# Patient Record
Sex: Female | Born: 1960 | Race: White | Hispanic: No | Marital: Married | Smoking: Former smoker
Health system: Southern US, Community
[De-identification: ages and names within clinical notes are randomized; demographics above are authoritative.]

## PROBLEM LIST (undated history)

## (undated) DIAGNOSIS — F32A Depression, unspecified: Secondary | ICD-10-CM

## (undated) DIAGNOSIS — M1990 Unspecified osteoarthritis, unspecified site: Secondary | ICD-10-CM

## (undated) DIAGNOSIS — F329 Major depressive disorder, single episode, unspecified: Secondary | ICD-10-CM

## (undated) DIAGNOSIS — E039 Hypothyroidism, unspecified: Secondary | ICD-10-CM

## (undated) DIAGNOSIS — R0789 Other chest pain: Secondary | ICD-10-CM

## (undated) DIAGNOSIS — F419 Anxiety disorder, unspecified: Secondary | ICD-10-CM

## (undated) HISTORY — DX: Hypothyroidism, unspecified: E03.9

## (undated) HISTORY — PX: PARTIAL HYSTERECTOMY: SHX80

## (undated) HISTORY — PX: TUBAL LIGATION: SHX77

---
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
## 1997-08-06 ENCOUNTER — Ambulatory Visit (HOSPITAL_COMMUNITY): Admission: RE | Admit: 1997-08-06 | Discharge: 1997-08-06 | Payer: Self-pay | Admitting: Obstetrics & Gynecology

## 1997-08-08 ENCOUNTER — Inpatient Hospital Stay (HOSPITAL_COMMUNITY): Admission: AD | Admit: 1997-08-08 | Discharge: 1997-08-08 | Payer: Self-pay | Admitting: Obstetrics and Gynecology

## 1997-08-13 ENCOUNTER — Other Ambulatory Visit: Admission: RE | Admit: 1997-08-13 | Discharge: 1997-08-13 | Payer: Self-pay | Admitting: Obstetrics & Gynecology

## 1998-08-05 ENCOUNTER — Other Ambulatory Visit: Admission: RE | Admit: 1998-08-05 | Discharge: 1998-08-05 | Payer: Self-pay | Admitting: Obstetrics & Gynecology

## 2000-06-10 ENCOUNTER — Other Ambulatory Visit: Admission: RE | Admit: 2000-06-10 | Discharge: 2000-06-10 | Payer: Self-pay | Admitting: Obstetrics & Gynecology

## 2000-12-04 ENCOUNTER — Emergency Department (HOSPITAL_COMMUNITY): Admission: EM | Admit: 2000-12-04 | Discharge: 2000-12-05 | Payer: Self-pay | Admitting: Emergency Medicine

## 2000-12-04 ENCOUNTER — Encounter: Payer: Self-pay | Admitting: Emergency Medicine

## 2001-06-30 ENCOUNTER — Other Ambulatory Visit: Admission: RE | Admit: 2001-06-30 | Discharge: 2001-06-30 | Payer: Self-pay | Admitting: Obstetrics & Gynecology

## 2001-12-14 ENCOUNTER — Ambulatory Visit (HOSPITAL_COMMUNITY): Admission: RE | Admit: 2001-12-14 | Discharge: 2001-12-14 | Payer: Self-pay | Admitting: Obstetrics & Gynecology

## 2001-12-14 ENCOUNTER — Encounter: Payer: Self-pay | Admitting: Obstetrics & Gynecology

## 2002-08-11 ENCOUNTER — Other Ambulatory Visit: Admission: RE | Admit: 2002-08-11 | Discharge: 2002-08-11 | Payer: Self-pay | Admitting: Obstetrics & Gynecology

## 2003-09-07 ENCOUNTER — Other Ambulatory Visit: Admission: RE | Admit: 2003-09-07 | Discharge: 2003-09-24 | Payer: Self-pay | Admitting: *Deleted

## 2003-11-07 ENCOUNTER — Other Ambulatory Visit (HOSPITAL_COMMUNITY): Admission: RE | Admit: 2003-11-07 | Discharge: 2003-12-09 | Payer: Self-pay | Admitting: Psychiatry

## 2003-11-07 ENCOUNTER — Ambulatory Visit: Payer: Self-pay | Admitting: Psychiatry

## 2004-09-02 ENCOUNTER — Other Ambulatory Visit: Admission: RE | Admit: 2004-09-02 | Discharge: 2004-09-02 | Payer: Self-pay | Admitting: Obstetrics & Gynecology

## 2005-08-06 ENCOUNTER — Emergency Department (HOSPITAL_COMMUNITY): Admission: EM | Admit: 2005-08-06 | Discharge: 2005-08-07 | Payer: Self-pay | Admitting: Emergency Medicine

## 2005-08-07 ENCOUNTER — Inpatient Hospital Stay (HOSPITAL_COMMUNITY): Admission: EM | Admit: 2005-08-07 | Discharge: 2005-08-07 | Payer: Self-pay | Admitting: Psychiatry

## 2005-08-07 ENCOUNTER — Ambulatory Visit: Payer: Self-pay | Admitting: Psychiatry

## 2005-12-04 ENCOUNTER — Encounter: Admission: RE | Admit: 2005-12-04 | Discharge: 2005-12-04 | Payer: Self-pay | Admitting: Sports Medicine

## 2009-01-19 ENCOUNTER — Observation Stay (HOSPITAL_COMMUNITY): Admission: AD | Admit: 2009-01-19 | Discharge: 2009-01-20 | Payer: Self-pay | Admitting: Obstetrics and Gynecology

## 2010-01-16 ENCOUNTER — Encounter: Payer: Self-pay | Admitting: Endocrinology

## 2010-01-22 ENCOUNTER — Ambulatory Visit: Payer: Self-pay | Admitting: Endocrinology

## 2010-01-22 DIAGNOSIS — E039 Hypothyroidism, unspecified: Secondary | ICD-10-CM | POA: Insufficient documentation

## 2010-01-22 DIAGNOSIS — L659 Nonscarring hair loss, unspecified: Secondary | ICD-10-CM | POA: Insufficient documentation

## 2010-01-22 DIAGNOSIS — D649 Anemia, unspecified: Secondary | ICD-10-CM | POA: Insufficient documentation

## 2010-01-22 DIAGNOSIS — R252 Cramp and spasm: Secondary | ICD-10-CM | POA: Insufficient documentation

## 2010-01-22 LAB — CONVERTED CEMR LAB
BUN: 8 mg/dL (ref 6–23)
Basophils Absolute: 0 10*3/uL (ref 0.0–0.1)
Basophils Relative: 0.4 % (ref 0.0–3.0)
CO2: 28 meq/L (ref 19–32)
Calcium: 8.9 mg/dL (ref 8.4–10.5)
Chloride: 106 meq/L (ref 96–112)
Creatinine, Ser: 0.7 mg/dL (ref 0.4–1.2)
Eosinophils Absolute: 0.1 10*3/uL (ref 0.0–0.7)
Eosinophils Relative: 1.7 % (ref 0.0–5.0)
GFR calc non Af Amer: 92.04 mL/min (ref >60)
Glucose, Bld: 94 mg/dL (ref 70–99)
HCT: 39.5 % (ref 36.0–46.0)
Hemoglobin: 13.9 g/dL (ref 12.0–15.0)
Iron: 13 ug/dL — ABNORMAL LOW (ref 42–145)
Lymphocytes Relative: 27.6 % (ref 12.0–46.0)
Lymphs Abs: 1.6 10*3/uL (ref 0.7–4.0)
MCHC: 35.1 g/dL (ref 30.0–36.0)
MCV: 91.6 fL (ref 78.0–100.0)
Monocytes Absolute: 0.6 10*3/uL (ref 0.1–1.0)
Monocytes Relative: 10.4 % (ref 3.0–12.0)
Neutro Abs: 3.5 10*3/uL (ref 1.4–7.7)
Neutrophils Relative %: 59.9 % (ref 43.0–77.0)
Platelets: 215 10*3/uL (ref 150.0–400.0)
Potassium: 4.2 meq/L (ref 3.5–5.1)
RBC: 4.32 M/uL (ref 3.87–5.11)
RDW: 11.9 % (ref 11.5–14.6)
Saturation Ratios: 3.7 % — ABNORMAL LOW (ref 20.0–50.0)
Sodium: 140 meq/L (ref 135–145)
Transferrin: 250.4 mg/dL (ref 212.0–360.0)
Vitamin B-12: 323 pg/mL (ref 211–911)
WBC: 5.9 10*3/uL (ref 4.5–10.5)

## 2010-01-23 LAB — CONVERTED CEMR LAB
Calcium, Total (PTH): 9.2 mg/dL (ref 8.4–10.5)
PTH: 30.9 pg/mL (ref 14.0–72.0)

## 2010-04-05 ENCOUNTER — Encounter: Payer: Self-pay | Admitting: Sports Medicine

## 2010-04-15 NOTE — Assessment & Plan Note (Signed)
Summary: New Endo/2nd opinion/Hypothyroid/Bcbs/#/cd   Vital Signs:  Patient profile:   50 year old female Height:      65 inches (165.10 cm) Weight:      163.50 pounds (74.32 kg) BMI:     27.31 O2 Sat:      99 % on Room air Temp:     98.7 degrees F (37.06 degrees C) oral Pulse rate:   87 / minute BP sitting:   114 / 80  (left arm) Cuff size:   regular  Vitals Entered By: Brenton Grills CMA Duncan Dull) (January 22, 2010 10:52 AM)  O2 Flow:  Room air CC: New Endo/2nd opinion Hypothyroid/aj Is Patient Diabetic? No   Primary Provider:  Joette Catching MD  CC:  New Endo/2nd opinion Hypothyroid/aj.  History of Present Illness: pt has 5 years h/o hypothyroidism.  she was found to have tsh of 18, and was rx'ed with synthroid.  sxs initially improved, and tsh normalized.   she now c/o 1 month of slight rash on the hairline, and assoc fatigue.    Current Medications (verified): 1)  Levothyroxine Sodium 100 Mcg Tabs (Levothyroxine Sodium) .Marland Kitchen.. 1 Tablet By Mouth Once Daily 2)  Folivane-Plus  Caps (Fefum-Fepoly-Fa-B Cmp-C-Biot) .Marland Kitchen.. 1 Capsule By Mouth Once Daily 3)  Vitamin B-2 25 Mg Tabs (Riboflavin) .Marland Kitchen.. 1 By Mouth Once Weekly 4)  Omeprazole-Sodium Bicarbonate 40-1100 Mg Caps (Omeprazole-Sodium Bicarbonate) .Marland Kitchen.. 1 Capsule By Mouth Once Daily  Allergies (verified): No Known Drug Allergies  Past History:  Past Medical History: CRAMP OF LIMB (ICD-729.82) ALOPECIA (ICD-704.00) UNSPECIFIED ANEMIA (ICD-285.9) HYPOTHYROIDISM (ICD-244.9) FAMILY HISTORY DIABETES 1ST DEGREE RELATIVE (ICD-V18.0) FAMILY HISTORY OF ALCOHOLISM/ADDICTION (ICD-V61.41)  Past Surgical History: Hysterectomy (2010)  Family History: Reviewed history and no changes required. Family History of Alcoholism/Addiction Family History of Arthritis Family History Diabetes 1st degree relative Family History High cholesterol Family History Hypertension no thyroid dz  Social History: Reviewed history and no changes  required. 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         denies sob, numbness, blurry vision, dry skin, syncope.  she has slight pain, which she attributes to ovarian cysts. no menses ([place] 2010).  she reports hair loss, muscle cramps, weight gain, difficulty with concentration, myalgias, and constipation.  she has cold intolerance, especially of the hands and feet (she says her hands get white when they are cold).  depression is better recently.   Physical Exam  General:  normal appearance.   Head:  head: no deformity eyes: no periorbital swelling, no proptosis external nose and ears are normal mouth: no lesion seen i am unable to detect hair loss Neck:  Supple without thyroid enlargement or tenderness.  Lungs:  Clear to auscultation bilaterally. Normal respiratory effort.  Heart:  Regular rate and rhythm without murmurs or gallops noted. Normal S1,S2.   Abdomen:  abdomen is soft, nontender.  no hepatosplenomegaly.   not distended.  no hernia  Msk:  muscle bulk and strength are grossly normal.  no obvious joint swelling.  gait is normal and steady  Extremities:  no deofrmity no edema Neurologic:  cn 2-12 grossly intact.   readily moves all 4's.   sensation is intact to touch on the feet  Skin:  normal texture and temp.  there is a moderate polypapular rash, at the hairline of the head.  not diaphoretic  Cervical Nodes:  No significant adenopathy.  Psych:  tearful only at one point of the visit Additional Exam:  outside test results are reviewed:  tsh=0.5 last week  today: Sodium                    140 mEq/L                   135-145   Potassium                 4.2 mEq/L                   3.5-5.1   Chloride                  106 mEq/L                   96-112   Carbon Dioxide            28 mEq/L                    19-32   Glucose                    94 mg/dL                    16-10   BUN                       8 mg/dL                     9-60   Creatinine                0.7 mg/dL                   4.5-4.0   Calcium                   8.9 mg/dL                   9.8-11.9     White Cell Count          5.9 K/uL                    4.5-10.5   Red Cell Count            4.32 Mil/uL                 3.87-5.11   Hemoglobin                13.9 g/dL                   14.7-82.9   Hematocrit                39.5 %                      36.0-46.0   MCV                       91.6 fl                     78.0-100.0   MCHC                      35.1 g/dL  30.0-36.0   RDW                       11.9 %                      11.5-14.6   Platelet Count            215.0 K/uL                  150.0-400.0    Iron Saturation      [L]  3.7 %                       20.0-50.0    Vitamin B12               323 pg/mL        Impression & Recommendations:  Problem # 1:  HYPOTHYROIDISM (ICD-244.9) well-replaced  Problem # 2:  rash uncertain etiology i am unaware of this being related to #1  Problem # 3:  ALOPECIA (ICD-704.00) mild could be autoimmune, as is #1  Problem # 4:  ? reynaud's sxs  Problem # 5:  fe-deficiency but not anemic  Medications Added to Medication List This Visit: 1)  Levothyroxine Sodium 100 Mcg Tabs (Levothyroxine sodium) .Marland Kitchen.. 1 tablet by mouth once daily 2)  Folivane-plus Caps (Fefum-fepoly-fa-b cmp-c-biot) .Marland Kitchen.. 1 capsule by mouth once daily 3)  Vitamin B-2 25 Mg Tabs (Riboflavin) .Marland Kitchen.. 1 by mouth once weekly 4)  Omeprazole-sodium Bicarbonate 40-1100 Mg Caps (Omeprazole-sodium bicarbonate) .Marland Kitchen.. 1 capsule by mouth once daily  Other Orders: T-Parathyroid Hormone, Intact w/ Calcium (40102-72536) TLB-BMP (Basic Metabolic Panel-BMET) (80048-METABOL) TLB-CBC Platelet - w/Differential (85025-CBCD) TLB-IBC Pnl (Iron/FE;Transferrin) (83550-IBC) TLB-B12, Serum-Total ONLY (64403-K74) Consultation Level IV  (25956)  Patient Instructions: 1)  cc drs nyland and neal. 2)  blood tests are being ordered for you today.  please call [phone] to hear your test results. 3)  please reconsider the antidepressant offered by dr nyland, as this has been found to sometimes improve unexplained symptoms. 4)  (update: i left message on phone-tree:  labs are normal).   Orders Added: 1)  T-Parathyroid Hormone, Intact w/ Calcium [32951-88416] 2)  TLB-BMP (Basic Metabolic Panel-BMET) [80048-METABOL] 3)  TLB-CBC Platelet - w/Differential [85025-CBCD] 4)  TLB-IBC Pnl (Iron/FE;Transferrin) [83550-IBC] 5)  TLB-B12, Serum-Total ONLY [82607-B12] 6)  Consultation Level IV [60630]

## 2010-06-09 DIAGNOSIS — R079 Chest pain, unspecified: Secondary | ICD-10-CM

## 2010-06-18 LAB — URINE MICROSCOPIC-ADD ON

## 2010-06-18 LAB — URINALYSIS, ROUTINE W REFLEX MICROSCOPIC
Glucose, UA: NEGATIVE mg/dL
Ketones, ur: NEGATIVE mg/dL
pH: 6.5 (ref 5.0–8.0)

## 2010-06-18 LAB — DIFFERENTIAL
Basophils Absolute: 0 10*3/uL (ref 0.0–0.1)
Lymphocytes Relative: 20 % (ref 12–46)
Neutro Abs: 5.8 10*3/uL (ref 1.7–7.7)

## 2010-06-18 LAB — CBC
Platelets: 254 10*3/uL (ref 150–400)
RDW: 13.4 % (ref 11.5–15.5)

## 2011-04-13 ENCOUNTER — Ambulatory Visit
Admission: RE | Admit: 2011-04-13 | Discharge: 2011-04-13 | Disposition: A | Discharge status: Home / Self Care | Payer: Federal, State, Local not specified - PPO | Source: Ambulatory Visit | Attending: Allergy and Immunology | Admitting: Allergy and Immunology

## 2011-04-13 ENCOUNTER — Other Ambulatory Visit: Payer: Self-pay | Admitting: Allergy and Immunology

## 2011-04-13 DIAGNOSIS — R05 Cough: Secondary | ICD-10-CM

## 2011-04-13 DIAGNOSIS — R059 Cough, unspecified: Secondary | ICD-10-CM

## 2011-09-25 ENCOUNTER — Encounter (INDEPENDENT_AMBULATORY_CARE_PROVIDER_SITE_OTHER): Payer: Self-pay | Admitting: Internal Medicine

## 2011-09-25 ENCOUNTER — Ambulatory Visit (INDEPENDENT_AMBULATORY_CARE_PROVIDER_SITE_OTHER): Payer: Federal, State, Local not specified - PPO | Admitting: Internal Medicine

## 2011-09-25 VITALS — BP 98/62 | HR 72 | Temp 98.7°F | Ht 67.0 in | Wt 164.2 lb

## 2011-09-25 DIAGNOSIS — G8929 Other chronic pain: Secondary | ICD-10-CM | POA: Insufficient documentation

## 2011-09-25 DIAGNOSIS — R1011 Right upper quadrant pain: Secondary | ICD-10-CM

## 2011-09-25 NOTE — Patient Instructions (Addendum)
Ct abdomen/pelvis with CM. If normal, OV with Dr. Karilyn Cota

## 2011-09-25 NOTE — Progress Notes (Signed)
Subjective:     Patient ID: Ariel Ramirez, female   DOB: 01/27/1961, 51 y.o.   MRN: [id number]  HPI Referred by Dr. Lysbeth Galas for rt upper quadrant pain x 3 weeks. She describes as a twisting type pain like a contraction.  The pain comes and goes. She got up this am and felt it and then felt it after eating. The pain is sporatic.There is no acid reflux. No nausea or vomiting. This pain is occuring about twice an hour. She has a hx of constipation.  She goes about every 2 days. Appetite is okay. No weight loss.    EGD 2 yrs ago: showed an ulcer.  09/22/2011 HIDA scan EF 51%. There is demonstration of patency of the CBD and the cystic duct. There is no evidence of cholecystitis. During CCK infusion the patient reported experiencing no symptoms.  09/15/11 Total bili 0.4, AST 19, ALT 19, ALP 45, Albumin 4.4, Total protein 7.1, amylase 56, Lipase 38 WBC 6.0, H and H 13.8 and 39.8, MCV 89.6  Urinalysis: WBC 0-5, RBC 0-5.  Abdomen 2 views 09/15/2011: Normal  09/16/2011 US abdomen: Normal. CBD WNL Review of Systems  See hpi  Current Outpatient Prescriptions  Medication Sig Dispense Refill  . clonazePAM (KLONOPIN) 0.5 MG tablet Take 0.5 mg by mouth 2 (two) times daily as needed.      Marland Kitchen co-enzyme Q-10 30 MG capsule Take 30 mg by mouth 3 (three) times daily.      Marland Kitchen escitalopram (LEXAPRO) 10 MG tablet Take 10 mg by mouth daily.      Marland Kitchen esomeprazole (NEXIUM) 40 MG capsule Take 40 mg by mouth daily before breakfast.      . gabapentin (NEURONTIN) 300 MG capsule Take 300 mg by mouth 3 (three) times daily.      Marland Kitchen l-methylfolate-b2-b6-b12 (CEREFOLIN) 08-14-48-5 MG TABS Take 1 tablet by mouth daily.      Marland Kitchen lamoTRIgine (LAMICTAL) 25 MG tablet Take 25 mg by mouth daily.      Marland Kitchen levothyroxine (SYNTHROID, LEVOTHROID) 125 MCG tablet Take 125 mcg by mouth daily.      Marland Kitchen selenium 50 MCG TABS Take 200 mcg by mouth daily.      . verapamil (CALAN-SR) 180 MG CR tablet Take 180 mg by mouth at bedtime.       Past Medical History   Diagnosis Date  . Hypothyroid    Past Surgical History  Procedure Date  . Partial hysterectomy     2011  . Tubal ligation     20 yrs ago   History   Social History  . Marital Status: Married    Spouse Name: N/A    Number of Children: N/A  . Years of Education: N/A   Occupational History  . Not on file.   Social History Main Topics  . Smoking status: Former Games developer  . Smokeless tobacco: Not on file  . Alcohol Use: Yes     on occassion  . Drug Use: No  . Sexually Active: Not on file   Other Topics Concern  . Not on file   Social History Narrative  . No narrative on file   No family status information on file.         Objective:   Physical Exam Filed Vitals:   09/25/11 1050  Height: 5\' 7"  (1.702 m)  Weight: 164 lb 3.2 oz (74.481 kg)    Alert and oriented. Skin warm and dry. Oral mucosa is moist.   . Sclera anicteric,  conjunctivae is pink. Thyroid not enlarged. No cervical lymphadenopathy. Lungs clear. Heart regular rate and rhythm.  Abdomen is soft. Bowel sounds are positive. No hepatomegaly. No abdominal masses felt. No tenderness.  No edema to lower extremities. Patient is alert and oriented.     Assessment:   Rt upper quadrant pain ? Etiology. GB so far is negative. I discussed this case with Dr. Karilyn Cota.    Plan:   CT abdomen and Pelvis with CM. If normal OV with Dr. Karilyn Cota,.

## 2011-09-29 ENCOUNTER — Ambulatory Visit (HOSPITAL_COMMUNITY)
Admission: RE | Admit: 2011-09-29 | Discharge: 2011-09-29 | Disposition: A | Discharge status: Home / Self Care | Payer: Federal, State, Local not specified - PPO | Source: Ambulatory Visit | Attending: Internal Medicine | Admitting: Internal Medicine

## 2011-09-29 ENCOUNTER — Ambulatory Visit (HOSPITAL_COMMUNITY): Payer: Federal, State, Local not specified - PPO

## 2011-09-29 ENCOUNTER — Encounter (HOSPITAL_COMMUNITY): Payer: Self-pay

## 2011-09-29 DIAGNOSIS — Z90710 Acquired absence of both cervix and uterus: Secondary | ICD-10-CM | POA: Insufficient documentation

## 2011-09-29 DIAGNOSIS — R1011 Right upper quadrant pain: Secondary | ICD-10-CM | POA: Insufficient documentation

## 2011-09-29 DIAGNOSIS — G8929 Other chronic pain: Secondary | ICD-10-CM

## 2011-09-29 DIAGNOSIS — K5900 Constipation, unspecified: Secondary | ICD-10-CM | POA: Insufficient documentation

## 2011-09-29 MED ORDER — IOHEXOL 300 MG/ML  SOLN
100.0000 mL | Freq: Once | INTRAMUSCULAR | Status: AC | PRN
  Administered 2011-09-29: 100 mL via INTRAVENOUS

## 2011-10-08 ENCOUNTER — Encounter (INDEPENDENT_AMBULATORY_CARE_PROVIDER_SITE_OTHER): Payer: Self-pay

## 2011-10-26 ENCOUNTER — Encounter (INDEPENDENT_AMBULATORY_CARE_PROVIDER_SITE_OTHER): Payer: Self-pay | Admitting: Internal Medicine

## 2011-10-26 ENCOUNTER — Ambulatory Visit (INDEPENDENT_AMBULATORY_CARE_PROVIDER_SITE_OTHER): Payer: Federal, State, Local not specified - PPO | Admitting: Internal Medicine

## 2011-10-26 VITALS — BP 106/70 | HR 74 | Temp 98.1°F | Resp 20 | Ht 67.0 in | Wt 166.1 lb

## 2011-10-26 DIAGNOSIS — K219 Gastro-esophageal reflux disease without esophagitis: Secondary | ICD-10-CM | POA: Insufficient documentation

## 2011-10-26 DIAGNOSIS — R1011 Right upper quadrant pain: Secondary | ICD-10-CM

## 2011-10-26 MED ORDER — HYOSCYAMINE SULFATE 0.125 MG SL SUBL: 0.1250 mg | SUBLINGUAL_TABLET | Freq: Three times a day (TID) | SUBLINGUAL | Status: DC | PRN

## 2011-10-26 NOTE — Progress Notes (Signed)
Presenting complaint;  Recurrent right upper quadrant abdominal pain.  Subjective:  Patient is 51 year old Caucasian female who presents for further evaluation of the right upper quadrant pain. This pain started 6 weeks ago; initially it was stabbing and now it is dull aching pain. She is having this pain every day but it's intermittent. At times she wakes up with this pain. She seemed to have more pain with meals and other day she had a glass of wine and noted  burning  pain. She also complains of nausea but denies vomiting. Occasionally she may feel pain radiating to the left upper quadrant into her back on the right side. She has not experienced fever or chills dysuria or diarrhea. She has chronic constipation but with her medications bowels move daily. She also complains of bloating which is worse at night. She says her heartburn is well controlled with medication. She has been on PPI for 5-6 years. She also gives history of peptic ulcer disease diagnosed on EGD within the last 3 years. She denies weight loss.  Current Medications: Current Outpatient Prescriptions  Medication Sig Dispense Refill  . clonazePAM (KLONOPIN) 0.5 MG tablet Take 0.5 mg by mouth 2 (two) times daily as needed.      Marland Kitchen co-enzyme Q-10 30 MG capsule Take 30 mg by mouth daily.       Marland Kitchen escitalopram (LEXAPRO) 10 MG tablet Take 10 mg by mouth daily.      Marland Kitchen esomeprazole (NEXIUM) 40 MG capsule Take 40 mg by mouth daily before breakfast.      . fish oil-omega-3 fatty acids 1000 MG capsule Take 1 g by mouth daily.      Marland Kitchen gabapentin (NEURONTIN) 300 MG capsule Take 300 mg by mouth daily as needed.       . lamoTRIgine (LAMICTAL) 25 MG tablet Take 100 mg by mouth.       . levothyroxine (SYNTHROID, LEVOTHROID) 125 MCG tablet Take 125 mcg by mouth daily.      Marland Kitchen selenium 50 MCG TABS Take 200 mcg by mouth daily.      . verapamil (CALAN-SR) 180 MG CR tablet Take 180 mg by mouth at bedtime.        Objective: Blood pressure 106/70,  pulse 74, temperature 98.1 F (36.7 C), temperature source Oral, resp. rate 20, height 5\' 7"  (1.702 m), weight 166 lb 1.6 oz (75.342 kg). Patient is alert and in no acute distress. Conjunctiva is pink. Sclera is nonicteric Oropharyngeal mucosa is normal. No neck masses or thyromegaly noted. Cardiac exam with regular rhythm normal S1 and S2. No murmur or gallop noted. Lungs are clear to auscultation. Abdomen. Bowel sounds are normal. Abdomen is soft without tenderness organomegaly or masses.  No LE edema or clubbing noted.  Labs/studies Results: Normal LFTs on 09-15-2011. Normal upper abdominal ultrasound on 09/16/2011. HIDA scan on 09/22/2011 with EF of 51%. Abdominopelvic CT on 09/25/2011. Without any abnormality except for millimeter lesion in left lobe possibly a cyst. I have reviewed these imaging studies with the patient and her husband.   Assessment:  Recurrent right upper quadrant pain associated with nausea. Pain is triggered with meals at time. Ultrasound negative for cholelithiasis and she had EF 51% on HIDA scan. Since studies for GB disease are negative, need to rule out peptic ulcer disease which she has history of.   Plan:  Will review abdominopelvic CT with radiology regardingding abnormality to right diaphragm. Diagnostic EGD. Levsin SL 3 times a day when necessary.

## 2011-10-26 NOTE — Patient Instructions (Signed)
Take Levsin SL up to 3 times a day as needed for abdominal pain. Esophagogastroduodenoscopy to be scheduled after CT reviewed with radiologist.

## 2011-10-27 ENCOUNTER — Other Ambulatory Visit (INDEPENDENT_AMBULATORY_CARE_PROVIDER_SITE_OTHER): Payer: Self-pay | Admitting: *Deleted

## 2011-10-27 ENCOUNTER — Telehealth (INDEPENDENT_AMBULATORY_CARE_PROVIDER_SITE_OTHER): Payer: Self-pay | Admitting: *Deleted

## 2011-10-27 DIAGNOSIS — R1011 Right upper quadrant pain: Secondary | ICD-10-CM

## 2011-10-27 NOTE — Telephone Encounter (Signed)
Ann- Per Dr.Rehman go ahead and arrange EGD for Ariel Ramirez

## 2011-10-27 NOTE — Telephone Encounter (Signed)
EGD sch'd 11/09/11 @ 730 (630), patient aware, verbal instructions given

## 2011-10-28 ENCOUNTER — Encounter (HOSPITAL_COMMUNITY): Payer: Self-pay | Admitting: Pharmacy Technician

## 2011-11-09 ENCOUNTER — Encounter (HOSPITAL_COMMUNITY)
Admission: RE | Disposition: A | Discharge status: Home / Self Care | Payer: Self-pay | Source: Ambulatory Visit | Attending: Internal Medicine

## 2011-11-09 ENCOUNTER — Ambulatory Visit (HOSPITAL_COMMUNITY)
Admission: RE | Admit: 2011-11-09 | Discharge: 2011-11-09 | Disposition: A | Discharge status: Home / Self Care | Payer: Federal, State, Local not specified - PPO | Source: Ambulatory Visit | Attending: Internal Medicine | Admitting: Internal Medicine

## 2011-11-09 ENCOUNTER — Encounter (HOSPITAL_COMMUNITY): Payer: Self-pay | Admitting: *Deleted

## 2011-11-09 DIAGNOSIS — R1013 Epigastric pain: Secondary | ICD-10-CM | POA: Insufficient documentation

## 2011-11-09 DIAGNOSIS — R110 Nausea: Secondary | ICD-10-CM | POA: Insufficient documentation

## 2011-11-09 DIAGNOSIS — K449 Diaphragmatic hernia without obstruction or gangrene: Secondary | ICD-10-CM | POA: Insufficient documentation

## 2011-11-09 DIAGNOSIS — R1011 Right upper quadrant pain: Secondary | ICD-10-CM

## 2011-11-09 HISTORY — DX: Anxiety disorder, unspecified: F41.9

## 2011-11-09 HISTORY — DX: Depression, unspecified: F32.A

## 2011-11-09 HISTORY — PX: ESOPHAGOGASTRODUODENOSCOPY: SHX5428

## 2011-11-09 HISTORY — DX: Major depressive disorder, single episode, unspecified: F32.9

## 2011-11-09 HISTORY — DX: Unspecified osteoarthritis, unspecified site: M19.90

## 2011-11-09 SURGERY — EGD (ESOPHAGOGASTRODUODENOSCOPY)
Anesthesia: Moderate Sedation

## 2011-11-09 MED ORDER — SODIUM CHLORIDE 0.45 % IV SOLN
INTRAVENOUS | Status: DC
  Administered 2011-11-09: 1000 mL via INTRAVENOUS

## 2011-11-09 MED ORDER — STERILE WATER FOR IRRIGATION IR SOLN
Status: DC | PRN
  Administered 2011-11-09: 08:00:00

## 2011-11-09 MED ORDER — BUTAMBEN-TETRACAINE-BENZOCAINE 2-2-14 % EX AERO
INHALATION_SPRAY | CUTANEOUS | Status: DC | PRN
  Administered 2011-11-09: 2 via TOPICAL

## 2011-11-09 MED ORDER — MEPERIDINE HCL 50 MG/ML IJ SOLN
INTRAMUSCULAR | Status: AC
  Filled 2011-11-09: qty 1

## 2011-11-09 MED ORDER — MEPERIDINE HCL 25 MG/ML IJ SOLN
INTRAMUSCULAR | Status: DC | PRN
  Administered 2011-11-09 (×2): 25 mg via INTRAVENOUS

## 2011-11-09 MED ORDER — MIDAZOLAM HCL 5 MG/5ML IJ SOLN
INTRAMUSCULAR | Status: DC | PRN
  Administered 2011-11-09: 2 mg via INTRAVENOUS
  Administered 2011-11-09: 1 mg via INTRAVENOUS
  Administered 2011-11-09 (×2): 2 mg via INTRAVENOUS

## 2011-11-09 MED ORDER — MIDAZOLAM HCL 5 MG/5ML IJ SOLN
INTRAMUSCULAR | Status: AC
  Filled 2011-11-09: qty 10

## 2011-11-09 NOTE — Op Note (Signed)
EGD PROCEDURE REPORT  PATIENT:  Ariel Ramirez  MR#:  [id number] Birthdate:  1961-03-14, 51 y.o., female Endoscopist:  Dr. Malissa Hippo, MD Referred By:  Dr. Josue Hector, MD Procedure Date: 11/09/2011  Procedure:   EGD  Indications:  Patient is 51 year old Caucasian female with recurrent epigastric and right quadrant pain associated with  nausea. She has undergone extensive workup which has been negative including CBC, LFTs, ultrasound, HIDA scan and abdominopelvic CT. She has chronic GERD and her heartburn appears to be well controlled. She is undergoing diagnostic EGD to rule out peptic ulcer disease which she has history of prior considering cholecystectomy.            Informed Consent:  The risks, benefits, alternatives & imponderables which include, but are not limited to, bleeding, infection, perforation, drug reaction and potential missed lesion have been reviewed.  The potential for biopsy, lesion removal, esophageal dilation, etc. have also been discussed.  Questions have been answered.  All parties agreeable.  Please see history & physical in medical record for more information.  Medications:  Demerol 50 mg IV Versed 7 mg IV Cetacaine spray topically for oropharyngeal anesthesia  Description of procedure:  The endoscope was introduced through the mouth and advanced to the second portion of the duodenum without difficulty or limitations. The mucosal surfaces were surveyed very carefully during advancement of the scope and upon withdrawal.  Findings:  Esophagus:  Mucosa of the esophagus was normal. GE junction was unremarkable. GEJ:  35 cm Hiatus:  38 cm Stomach:  Stomach was empty and distended very well with insufflation. Folds in the proximal stomach are normal. Examination of mucosa at body, antrum pyloric, channel as well as angularis fundus and cardia was normal. Duodenum:  Normal bulbar and post bulbar mucosa. Ampulla Vater also appeared to be  normal.  Therapeutic/Diagnostic Maneuvers Performed:  None  Complications:  None  Impression: Small sliding hiatal hernia otherwise normal EGD.  Recommendations:  Continue antireflux measures and PPI. Surgical consultation for possible cholecystectomy.  Ariel Ramirez U  11/09/2011  7:57 AM  CC: Dr. Josue Hector, MD & Dr. Bonnetta Barry ref. provider found

## 2011-11-09 NOTE — H&P (Signed)
Ariel Ramirez is an 51 y.o. female.   Chief Complaint: Patient is here for esophagogastroduodenoscopy. HPI: Patient is 51 year old Caucasian female with recurrent right upper quadrant epigastric pain with intermittent nausea not responding to PPI. Workup for very tract disease has been negative. She also had negative on a pelvic CT and her CBC and LFTs have been normal. She has been taking NSAIDs intermittently. She is undergoing diagnostic EGD to rule out peptic ulcer disease before surgical consultation consider for cholecystectomy. There is no history of vomiting melena or rectal bleeding. Past Medical History  Diagnosis Date  . Hypothyroid   . Anxiety   . Depression   . Arthritis     Past Surgical History  Procedure Date  . Partial hysterectomy     2011  . Tubal ligation     20 yrs ago    Family History  Problem Relation Age of Onset  . Arthritis Mother   . Stroke Father   . Hypertension Father   . Diabetes Father   . Healthy Son   . Healthy Son   . Healthy Daughter    Social History:  reports that she quit smoking about 4 years ago. Her smoking use included Cigarettes. She has never used smokeless tobacco. She reports that she drinks alcohol. She reports that she does not use illicit drugs.  Allergies: No Known Allergies  Medications Prior to Admission  Medication Sig Dispense Refill  . clonazePAM (KLONOPIN) 0.5 MG tablet Take 0.5 mg by mouth 2 (two) times daily as needed. Anxiety      . Coenzyme Q10 200 MG capsule Take 200 mg by mouth daily.      Marland Kitchen escitalopram (LEXAPRO) 10 MG tablet Take 10 mg by mouth daily.      Marland Kitchen esomeprazole (NEXIUM) 40 MG capsule Take 40 mg by mouth daily before breakfast.      . estradiol (ESTRACE) 0.5 MG tablet Take 0.5 mg by mouth daily.      . fish oil-omega-3 fatty acids 1000 MG capsule Take 1 g by mouth daily.      Marland Kitchen gabapentin (NEURONTIN) 300 MG capsule Take 300 mg by mouth daily as needed. Anxiety Attacks      . lamoTRIgine (LAMICTAL)  100 MG tablet Take 100 mg by mouth daily.      Marland Kitchen levothyroxine (SYNTHROID, LEVOTHROID) 125 MCG tablet Take 125 mcg by mouth daily.      Marland Kitchen lubiprostone (AMITIZA) 24 MCG capsule Take 24 mcg by mouth daily as needed. Constipation      . Melaton-Thean-Cham-PassF-LBalm (MELATONIN + L-THEANINE PO) Take 1 tablet by mouth at bedtime.      . Selenium (SELENICAPS-200 PO) Take 1 capsule by mouth daily.      . SUMAtriptan (IMITREX) 100 MG tablet Take 100 mg by mouth every 2 (two) hours as needed. Migraine      . thiamine (VITAMIN B-1) 100 MG tablet Take 100 mg by mouth daily.      . verapamil (CALAN-SR) 180 MG CR tablet Take 180 mg by mouth daily as needed. Headache        No results found for this or any previous visit (from the past 48 hour(s)). No results found.  ROS  Blood pressure 131/70, pulse 60, temperature 98 F (36.7 C), temperature source Oral, resp. rate 16, height 5\' 7"  (1.702 m), weight 163 lb (73.936 kg), SpO2 100.00%. Physical Exam  Constitutional: She appears well-developed and well-nourished.  HENT:  Mouth/Throat: Oropharynx is clear and moist.  Eyes: Conjunctivae are normal. No scleral icterus.  Neck: No thyromegaly present.  Cardiovascular: Normal rate, regular rhythm and normal heart sounds.   No murmur heard. Respiratory: Effort normal.  GI: Soft. She exhibits no distension and no mass. There is no tenderness.  Musculoskeletal: She exhibits no edema.  Lymphadenopathy:    She has no cervical adenopathy.  Neurological: She is alert.  Skin: Skin is warm and dry.     Assessment/Plan Recurrent epigastric and right upper quadrant pain with negative work including ultrasound HIDA scan and CT and blood work. Diagnostic EGD.  Kemonte Ullman U 11/09/2011, 7:38 AM

## 2011-11-11 ENCOUNTER — Encounter (HOSPITAL_COMMUNITY): Payer: Self-pay | Admitting: Internal Medicine

## 2011-11-12 ENCOUNTER — Encounter (HOSPITAL_COMMUNITY): Payer: Self-pay

## 2011-11-12 ENCOUNTER — Encounter (HOSPITAL_COMMUNITY): Payer: Self-pay | Admitting: Pharmacy Technician

## 2011-11-12 ENCOUNTER — Encounter (HOSPITAL_COMMUNITY)
Admission: RE | Admit: 2011-11-12 | Discharge: 2011-11-12 | Disposition: A | Discharge status: Home / Self Care | Payer: Federal, State, Local not specified - PPO | Source: Ambulatory Visit | Attending: General Surgery | Admitting: General Surgery

## 2011-11-12 LAB — CBC WITH DIFFERENTIAL/PLATELET
Eosinophils Absolute: 0.1 10*3/uL (ref 0.0–0.7)
Eosinophils Relative: 1 % (ref 0–5)
HCT: 37.1 % (ref 36.0–46.0)
Hemoglobin: 13 g/dL (ref 12.0–15.0)
Lymphocytes Relative: 47 % — ABNORMAL HIGH (ref 12–46)
Lymphs Abs: 2.6 10*3/uL (ref 0.7–4.0)
MCH: 30.7 pg (ref 26.0–34.0)
MCV: 87.5 fL (ref 78.0–100.0)
Monocytes Absolute: 0.4 10*3/uL (ref 0.1–1.0)
Monocytes Relative: 8 % (ref 3–12)
RBC: 4.24 MIL/uL (ref 3.87–5.11)
WBC: 5.5 10*3/uL (ref 4.0–10.5)

## 2011-11-12 LAB — COMPREHENSIVE METABOLIC PANEL
BUN: 10 mg/dL (ref 6–23)
CO2: 29 mEq/L (ref 19–32)
Calcium: 9.6 mg/dL (ref 8.4–10.5)
GFR calc Af Amer: >90 mL/min (ref >90)
GFR calc non Af Amer: >90 mL/min (ref >90)
Glucose, Bld: 87 mg/dL (ref 70–99)
Total Protein: 6.6 g/dL (ref 6.0–8.3)

## 2011-11-12 LAB — SURGICAL PCR SCREEN: MRSA, PCR: NEGATIVE

## 2011-11-12 NOTE — H&P (Signed)
NT[facility] SOAP Note  Vital Signs:  Vitals as of: 11/12/2011: Systolic 126: Diastolic 68: Heart Rate 55: Temp 97.84F: Height 47ft 7in: Weight 166Lbs 0 Ounces: Pain Level 7: BMI 26  BMI : 26 kg/m2  Subjective: This 51 Years 28 Months old Female presents for of    ABDOMINAL ISSUES: ,Has been having right upper quadrant abdominal pain, nausea, and bloating.  Has been having biliary colic like symptoms for some time now.  GI workup negative.  HIDA showed 51% ejection fraction.  Review of Symptoms:    Hot/cold    migraines Eyes:unremarkable   sinus Cardiovascular:  unremarkable   Respiratory:unremarkable   Gastrointestinal: abdominal pain,nausea,vomiting Genitourinary:unremarkable       back, neck, joint pain Skin:unremarkable Hematolgic/Lymphatic:unremarkable     Allergic/Immunologic:unremarkable     Past Medical History:    Reviewed   Past Medical History  Surgical History: partial hysterectomy, BTL Medical Problems:  Hypothyroidism, migraine Allergies: nkda Medications: estradiol, gabapentin, lamotrigine, verapamil, levothyroxin   Social History:Reviewed  Social History  Preferred Language: English ([place]) Race:  White Ethnicity: Not Hispanic / Latino Age: 51 Years 5 Months Marital Status:  M Alcohol: socially Recreational drug(s):  No   Smoking Status: Never smoker reviewed on 11/12/2011  Family History:  Reviewed   Family History  Is there a family history ZH:[id number], stroke, htn    Objective Information: General:  Well appearing, well nourished in no distress. Head:Atraumatic; no masses; no abnormalities   no scleral icterus Heart:  RRR, no murmur Lungs:    CTA bilaterally, no wheezes, rhonchi, rales.  Breathing unlabored. Abdomen:Soft, NT/ND, no HSM, no masses.  Assessment:Biliary colic, chronic cholecystitis  Diagnosis &amp; Procedure: DiagnosisCode: 575.11, ProcedureCode: 46962,     Plan:  Scheduled for laparoscopic cholecystectomy on 11/13/11.  She knows that this is the next "test", and that her symptoms could recur.   Patient Education:Alternative treatments to surgery were discussed with patient (and family).  Risks and benefits  of procedure were fully explained to the patient (and family) who gave informed consent. Patient/family questions were addressed.  Follow-up:Pending Surgery

## 2011-11-12 NOTE — Patient Instructions (Addendum)
Your procedure is scheduled on:  11/13/11  Report to [facility] at      11:10 AM.  Call this number if you have problems the morning of surgery: [phone]   Remember:   Do not drink or eat food:After Midnight.    Take these medicines the morning of surgery with A SIP OF WATER: synthroid, nexium, lexapro, klonipin   Do not wear jewelry, make-up or nail polish.  Do not wear lotions, powders, or perfumes. You may wear deodorant.  Do not shave 48 hours prior to surgery. Men may shave face and neck.  Do not bring valuables to the hospital.  Contacts, dentures or bridgework may not be worn into surgery.  Leave suitcase in the car. After surgery it may be brought to your room.  For patients admitted to the hospital, checkout time is 11:00 AM the day of discharge.   Patients discharged the day of surgery will not be allowed to drive home.    Special Instructions: CHG Shower Use Special Wash: 1/2 bottle night before surgery and 1/2 bottle morning of surgery.   Please read over the following fact sheets that you were given: Pain Booklet, MRSA Information, Surgical Site Infection Prevention, Anesthesia Post-op Instructions and Care and Recovery After Surgery  Laparoscopic Cholecystectomy Care After These instructions give you information on caring for yourself after your procedure. Your doctor may also give you more specific instructions. Call your doctor if you have any problems or questions after your procedure. HOME CARE  Change your bandages (dressings) as told by your doctor.   Keep the wound dry and clean. Wash the wound gently with soap and water. Pat the wound dry with a clean towel.   Do not take baths, swim, or use hot tubs for 10 days, or as told by your doctor.   Only take medicine as told by your doctor.   Eat a normal diet as told by your doctor.   Do not lift anything heavier than 25 pounds (11.5 kg), or as told by your doctor.   Do not play contact sports for 1 week, or  as told by your doctor.  GET HELP RIGHT AWAY IF:   Your wound is red, puffy (swollen), or painful.   You have yellowish-white fluid (pus) coming from the wound.   You have fluid draining from the wound for more than 1 day.   You have a bad smell coming from the wound.   Your wound breaks open.   You have a rash.   You have trouble breathing.   You have chest pain.   You have a bad reaction to your medicine.   You have a fever.   You have pain in the shoulders (shoulder strap areas).   You feel dizzy or pass out (faint).   You have severe belly (abdominal) pain.   You feel sick to your stomach (nauseous) or throw up (vomit) for more than 1 day.  MAKE SURE YOU:  Understand these instructions.   Will watch your condition.   Will get help right away if you are not doing well or get worse.  Document Released: 12/10/2007 Document Revised: 02/19/2011 Document Reviewed: 08/19/2010 [facility] Patient Information 2012 [place], [place].Instructions Following General Anesthetic, Adult A nurse specialized in giving anesthesia (anesthetist) or a doctor specialized in giving anesthesia (anesthesiologist) gave you a medicine that made you sleep while a procedure was performed. For as long as 24 hours following this procedure, you may feel:  Dizzy.  Weak.   Drowsy.  AFTER THE PROCEDURE After surgery, you will be taken to the recovery area where a nurse will monitor your progress. You will be allowed to go home when you are awake, stable, taking fluids well, and without complications. For the first 24 hours following an anesthetic:  Have a responsible person with you.   Do not drive a car. If you are alone, do not take public transportation.   Do not drink alcohol.   Do not take medicine that has not been prescribed by your caregiver.   Do not sign important papers or make important decisions.   You may resume normal diet and activities as directed.   Change bandages  (dressings) as directed.   Only take over-the-counter or prescription medicines for pain, discomfort, or fever as directed by your caregiver.  If you have questions or problems that seem related to the anesthetic, call the hospital and ask for the anesthetist or anesthesiologist on call. SEEK IMMEDIATE MEDICAL CARE IF:   You develop a rash.   You have difficulty breathing.   You have chest pain.   You develop any allergic problems.  Document Released: 06/08/2000 Document Revised: 02/19/2011 Document Reviewed: 01/17/2007 [facility] Patient Information 2012 [place], [place].

## 2011-11-13 ENCOUNTER — Encounter (HOSPITAL_COMMUNITY): Payer: Self-pay | Admitting: *Deleted

## 2011-11-13 ENCOUNTER — Ambulatory Visit (HOSPITAL_COMMUNITY): Payer: Federal, State, Local not specified - PPO | Admitting: Anesthesiology

## 2011-11-13 ENCOUNTER — Encounter (HOSPITAL_COMMUNITY)
Admission: RE | Disposition: A | Discharge status: Home / Self Care | Payer: Self-pay | Source: Ambulatory Visit | Attending: General Surgery

## 2011-11-13 ENCOUNTER — Encounter (HOSPITAL_COMMUNITY): Payer: Self-pay | Admitting: Anesthesiology

## 2011-11-13 ENCOUNTER — Ambulatory Visit (HOSPITAL_COMMUNITY)
Admission: RE | Admit: 2011-11-13 | Discharge: 2011-11-13 | Disposition: A | Discharge status: Home / Self Care | Payer: Federal, State, Local not specified - PPO | Source: Ambulatory Visit | Attending: General Surgery | Admitting: General Surgery

## 2011-11-13 DIAGNOSIS — Z01812 Encounter for preprocedural laboratory examination: Secondary | ICD-10-CM | POA: Insufficient documentation

## 2011-11-13 DIAGNOSIS — K811 Chronic cholecystitis: Secondary | ICD-10-CM | POA: Insufficient documentation

## 2011-11-13 HISTORY — PX: CHOLECYSTECTOMY: SHX55

## 2011-11-13 SURGERY — LAPAROSCOPIC CHOLECYSTECTOMY
Anesthesia: General | Site: Abdomen | Wound class: Contaminated

## 2011-11-13 MED ORDER — CEFAZOLIN SODIUM-DEXTROSE 2-3 GM-% IV SOLR
2.0000 g | INTRAVENOUS | Status: AC
  Administered 2011-11-13: 2 g via INTRAVENOUS

## 2011-11-13 MED ORDER — ONDANSETRON HCL 4 MG/2ML IJ SOLN
INTRAMUSCULAR | Status: AC
  Filled 2011-11-13: qty 2

## 2011-11-13 MED ORDER — GLYCOPYRROLATE 0.2 MG/ML IJ SOLN
INTRAMUSCULAR | Status: DC | PRN
  Administered 2011-11-13: .5 mg via INTRAVENOUS

## 2011-11-13 MED ORDER — BUPIVACAINE HCL 0.5 % IJ SOLN
INTRAMUSCULAR | Status: DC | PRN
  Administered 2011-11-13: 10 mL

## 2011-11-13 MED ORDER — MIDAZOLAM HCL 2 MG/2ML IJ SOLN
1.0000 mg | INTRAMUSCULAR | Status: AC | PRN
  Administered 2011-11-13 (×3): 2 mg via INTRAVENOUS

## 2011-11-13 MED ORDER — FENTANYL CITRATE 0.05 MG/ML IJ SOLN
25.0000 ug | INTRAMUSCULAR | Status: DC | PRN
Start: 2011-11-13 — End: 2011-11-13
  Administered 2011-11-13: 50 ug via INTRAVENOUS

## 2011-11-13 MED ORDER — SODIUM CHLORIDE 0.9 % IR SOLN
Status: DC | PRN
  Administered 2011-11-13: 1000 mL

## 2011-11-13 MED ORDER — NEOSTIGMINE METHYLSULFATE 1 MG/ML IJ SOLN
INTRAMUSCULAR | Status: AC
  Filled 2011-11-13: qty 10

## 2011-11-13 MED ORDER — GLYCOPYRROLATE 0.2 MG/ML IJ SOLN
INTRAMUSCULAR | Status: AC
  Filled 2011-11-13: qty 2

## 2011-11-13 MED ORDER — GLYCOPYRROLATE 0.2 MG/ML IJ SOLN
0.2000 mg | Freq: Once | INTRAMUSCULAR | Status: AC
  Administered 2011-11-13: 0.2 mg via INTRAVENOUS

## 2011-11-13 MED ORDER — FENTANYL CITRATE 0.05 MG/ML IJ SOLN
INTRAMUSCULAR | Status: DC | PRN
  Administered 2011-11-13 (×7): 50 ug via INTRAVENOUS

## 2011-11-13 MED ORDER — MIDAZOLAM HCL 2 MG/2ML IJ SOLN
INTRAMUSCULAR | Status: AC
  Filled 2011-11-13: qty 2

## 2011-11-13 MED ORDER — ROCURONIUM BROMIDE 100 MG/10ML IV SOLN
INTRAVENOUS | Status: DC | PRN
  Administered 2011-11-13: 5 mg via INTRAVENOUS
  Administered 2011-11-13: 25 mg via INTRAVENOUS

## 2011-11-13 MED ORDER — SCOPOLAMINE 1 MG/3DAYS TD PT72
1.0000 | MEDICATED_PATCH | Freq: Once | TRANSDERMAL | Status: DC
  Administered 2011-11-13: 1.5 mg via TRANSDERMAL

## 2011-11-13 MED ORDER — OXYCODONE-ACETAMINOPHEN 7.5-325 MG PO TABS: 1.0000 | ORAL_TABLET | ORAL | Status: AC | PRN

## 2011-11-13 MED ORDER — LIDOCAINE HCL 1 % IJ SOLN
INTRAMUSCULAR | Status: DC | PRN
  Administered 2011-11-13: 40 mg via INTRADERMAL

## 2011-11-13 MED ORDER — FENTANYL CITRATE 0.05 MG/ML IJ SOLN
INTRAMUSCULAR | Status: AC
  Filled 2011-11-13: qty 2

## 2011-11-13 MED ORDER — CEFAZOLIN SODIUM 1-5 GM-% IV SOLN
INTRAVENOUS | Status: AC
  Filled 2011-11-13: qty 100

## 2011-11-13 MED ORDER — NEOSTIGMINE METHYLSULFATE 1 MG/ML IJ SOLN
INTRAMUSCULAR | Status: DC | PRN
  Administered 2011-11-13: 3 mg via INTRAVENOUS

## 2011-11-13 MED ORDER — PROPOFOL 10 MG/ML IV EMUL
INTRAVENOUS | Status: AC
  Filled 2011-11-13: qty 20

## 2011-11-13 MED ORDER — LIDOCAINE HCL (PF) 1 % IJ SOLN
INTRAMUSCULAR | Status: AC
  Filled 2011-11-13: qty 5

## 2011-11-13 MED ORDER — FENTANYL CITRATE 0.05 MG/ML IJ SOLN
INTRAMUSCULAR | Status: AC
  Filled 2011-11-13: qty 5

## 2011-11-13 MED ORDER — GLYCOPYRROLATE 0.2 MG/ML IJ SOLN
INTRAMUSCULAR | Status: AC
  Filled 2011-11-13: qty 1

## 2011-11-13 MED ORDER — BUPIVACAINE HCL (PF) 0.5 % IJ SOLN
INTRAMUSCULAR | Status: AC
  Filled 2011-11-13: qty 30

## 2011-11-13 MED ORDER — ROCURONIUM BROMIDE 50 MG/5ML IV SOLN
INTRAVENOUS | Status: AC
  Filled 2011-11-13: qty 1

## 2011-11-13 MED ORDER — KETOROLAC TROMETHAMINE 30 MG/ML IJ SOLN: 30.0000 mg | Freq: Once | INTRAMUSCULAR | Status: DC

## 2011-11-13 MED ORDER — SCOPOLAMINE 1 MG/3DAYS TD PT72
MEDICATED_PATCH | TRANSDERMAL | Status: AC
  Filled 2011-11-13: qty 1

## 2011-11-13 MED ORDER — ONDANSETRON HCL 4 MG/2ML IJ SOLN: 4.0000 mg | Freq: Once | INTRAMUSCULAR | Status: DC | PRN

## 2011-11-13 MED ORDER — ONDANSETRON HCL 4 MG/2ML IJ SOLN
4.0000 mg | Freq: Once | INTRAMUSCULAR | Status: AC
  Administered 2011-11-13: 4 mg via INTRAVENOUS

## 2011-11-13 MED ORDER — DEXAMETHASONE SODIUM PHOSPHATE 4 MG/ML IJ SOLN
4.0000 mg | Freq: Once | INTRAMUSCULAR | Status: AC
  Administered 2011-11-13: 4 mg via INTRAVENOUS

## 2011-11-13 MED ORDER — LACTATED RINGERS IV SOLN
INTRAVENOUS | Status: DC
  Administered 2011-11-13: 1000 mL via INTRAVENOUS

## 2011-11-13 MED ORDER — HEMOSTATIC AGENTS (NO CHARGE) OPTIME
TOPICAL | Status: DC | PRN
  Administered 2011-11-13: 1 via TOPICAL

## 2011-11-13 MED ORDER — PROPOFOL 10 MG/ML IV BOLUS
INTRAVENOUS | Status: DC | PRN
  Administered 2011-11-13: 150 mg via INTRAVENOUS

## 2011-11-13 MED ORDER — KETOROLAC TROMETHAMINE 30 MG/ML IJ SOLN
INTRAMUSCULAR | Status: AC
  Filled 2011-11-13: qty 1

## 2011-11-13 MED ORDER — DEXAMETHASONE SODIUM PHOSPHATE 4 MG/ML IJ SOLN
INTRAMUSCULAR | Status: AC
  Filled 2011-11-13: qty 1

## 2011-11-13 SURGICAL SUPPLY — 34 items
APPLIER CLIP ROT 10 11.4 M/L (STAPLE) ×2
BAG HAMPER (MISCELLANEOUS) ×2 IMPLANT
CLIP APPLIE ROT 10 11.4 M/L (STAPLE) ×1 IMPLANT
CLOTH BEACON ORANGE TIMEOUT ST (SAFETY) ×2 IMPLANT
COVER LIGHT HANDLE STERIS (MISCELLANEOUS) ×4 IMPLANT
DECANTER SPIKE VIAL GLASS SM (MISCELLANEOUS) ×2 IMPLANT
DURAPREP 26ML APPLICATOR (WOUND CARE) ×2 IMPLANT
ELECT REM PT RETURN 9FT ADLT (ELECTROSURGICAL) ×2
ELECTRODE REM PT RTRN 9FT ADLT (ELECTROSURGICAL) ×1 IMPLANT
FILTER SMOKE EVAC LAPAROSHD (FILTER) ×2 IMPLANT
FORMALIN 10 PREFIL 120ML (MISCELLANEOUS) ×2 IMPLANT
GLOVE BIO SURGEON STRL SZ7.5 (GLOVE) ×2 IMPLANT
GLOVE BIOGEL PI IND STRL 7.0 (GLOVE) ×2 IMPLANT
GLOVE BIOGEL PI INDICATOR 7.0 (GLOVE) ×2
GLOVE ECLIPSE 6.5 STRL STRAW (GLOVE) ×2 IMPLANT
GLOVE OPTIFIT SS 6.5 STRL BRWN (GLOVE) ×2 IMPLANT
GOWN STRL REIN XL XLG (GOWN DISPOSABLE) ×8 IMPLANT
HEMOSTAT SNOW SURGICEL 2X4 (HEMOSTASIS) ×2 IMPLANT
INST SET LAPROSCOPIC AP (KITS) ×2 IMPLANT
KIT ROOM TURNOVER APOR (KITS) ×2 IMPLANT
KIT TROCAR LAP CHOLE (TROCAR) ×2 IMPLANT
MANIFOLD NEPTUNE II (INSTRUMENTS) ×2 IMPLANT
NS IRRIG 1000ML POUR BTL (IV SOLUTION) ×2 IMPLANT
PACK LAP CHOLE LZT030E (CUSTOM PROCEDURE TRAY) ×2 IMPLANT
PAD ARMBOARD 7.5X6 YLW CONV (MISCELLANEOUS) ×2 IMPLANT
POUCH SPECIMEN RETRIEVAL 10MM (ENDOMECHANICALS) ×2 IMPLANT
SET BASIN LINEN APH (SET/KITS/TRAYS/PACK) ×2 IMPLANT
SPONGE GAUZE 2X2 8PLY STRL LF (GAUZE/BANDAGES/DRESSINGS) ×8 IMPLANT
STAPLER VISISTAT (STAPLE) IMPLANT
SUT VICRYL 0 UR6 27IN ABS (SUTURE) ×2 IMPLANT
TAPE CLOTH SURG 4X10 WHT LF (GAUZE/BANDAGES/DRESSINGS) ×2 IMPLANT
WARMER LAPAROSCOPE (MISCELLANEOUS) ×2 IMPLANT
YANKAUER SUCT 12FT TUBE ARGYLE (SUCTIONS) IMPLANT
YANKAUER SUCT BULB TIP 10FT TU (MISCELLANEOUS) ×2 IMPLANT

## 2011-11-13 NOTE — Transfer of Care (Signed)
Immediate Anesthesia Transfer of Care Note  Patient: Ariel Ramirez  Procedure(s) Performed: Procedure(s) (LRB): LAPAROSCOPIC CHOLECYSTECTOMY (N/A)  Patient Location: PACU  Anesthesia Type: General  Level of Consciousness: awake, alert  and oriented  Airway & Oxygen Therapy: Patient Spontanous Breathing and Patient connected to face mask oxygen  Post-op Assessment: Report given to PACU RN  Post vital signs: Reviewed and stable  Complications: No apparent anesthesia complications

## 2011-11-13 NOTE — Anesthesia Procedure Notes (Signed)
Procedure Name: Intubation Date/Time: 11/13/2011 1:31 PM Performed by: Glynn Octave E Pre-anesthesia Checklist: Patient identified, Patient being monitored, Timeout performed, Emergency Drugs available and Suction available Patient Re-evaluated:Patient Re-evaluated prior to inductionOxygen Delivery Method: Circle System Utilized Preoxygenation: Pre-oxygenation with 100% oxygen Intubation Type: IV induction, Rapid sequence and Cricoid Pressure applied Ventilation: Mask ventilation without difficulty Laryngoscope Size: Mac and 3 Grade View: Grade I Tube type: Oral Tube size: 7.0 mm Number of attempts: 1 Airway Equipment and Method: stylet Placement Confirmation: ETT inserted through vocal cords under direct vision,  positive ETCO2 and breath sounds checked- equal and bilateral Secured at: 21 cm Tube secured with: Tape Dental Injury: Teeth and Oropharynx as per pre-operative assessment

## 2011-11-13 NOTE — Anesthesia Preprocedure Evaluation (Signed)
Anesthesia Evaluation  Patient identified by MRN, date of birth, ID band Patient awake    Reviewed: Allergy & Precautions, H&P , NPO status , Patient's Chart, lab work & pertinent test results  History of Anesthesia Complications Negative for: history of anesthetic complications  Airway Mallampati: I TM Distance: >3 FB     Dental  (+) Teeth Intact   Pulmonary neg pulmonary ROS,    Pulmonary exam normal       Cardiovascular negative cardio ROS  Rhythm:Regular Rate:Normal     Neuro/Psych PSYCHIATRIC DISORDERS Anxiety Depression    GI/Hepatic GERD-  Medicated and Controlled,  Endo/Other  Hypothyroidism   Renal/GU      Musculoskeletal   Abdominal   Peds  Hematology   Anesthesia Other Findings   Reproductive/Obstetrics                           Anesthesia Physical Anesthesia Plan  ASA: II  Anesthesia Plan: General   Post-op Pain Management:    Induction: Intravenous, Rapid sequence and Cricoid pressure planned  Airway Management Planned: Oral ETT  Additional Equipment:   Intra-op Plan:   Post-operative Plan: Extubation in OR  Informed Consent: I have reviewed the patients History and Physical, chart, labs and discussed the procedure including the risks, benefits and alternatives for the proposed anesthesia with the patient or authorized representative who has indicated his/her understanding and acceptance.     Plan Discussed with:   Anesthesia Plan Comments:         Anesthesia Quick Evaluation

## 2011-11-13 NOTE — Op Note (Signed)
Patient:  Ariel Ramirez  DOB:  [birth year]  MRN:  [id number]   Preop Diagnosis:  Chronic cholecystitis  Postop Diagnosis:  Same  Procedure:  Laparoscopic cholecystectomy  Surgeon:  Franky Macho, M.D.  Anes:  General endotracheal  Indications:  Patient is a 51 year old white female presents with biliary colic secondary to chronic cholecystitis. Risks and benefits of the procedure including bleeding, infection, hepatobiliary injury, the possibly of an open procedure were fully explained to the patient, gave informed consent.  Procedure note:  Patient was placed in the supine position. After induction of general endotracheal anesthesia, the abdomen was prepped and draped using the usual sterile technique with DuraPrep. Surgical site confirmation was performed.  A supraumbilical incision was made down to the fascia. A Veress needle was introduced into the abdominal cavity and confirmation of placement was done using the saline drop test. The abdomen was then insufflated to 16 mm mercury pressure. An 11 mm trocar was introduced into the abdominal cavity under direct visualization without difficulty. The patient was placed in reverse Trendelenburg position and additional 11 mm trocar was placed the epigastric region and 5 mm trochars were placed were upper quadrant right flank regions. The liver was inspected and noted within normal limits. The gallbladder was retracted superiorly and laterally. Dissection was begun around the infundibulum of the gallbladder. The cystic duct was first identified. Its junction are to the infundibulum was fully identified. Endoclips were placed proximally and distally on the cystic duct and the cystic duct was divided. This is likewise done to the cystic artery. The gallbladder was then freed away from the gallbladder fossa using Bovie electrocautery. The gallbladder was delivered through the epigastric trocar site using an Endo Catch bag. The gallbladder fossa was  inspected and no abnormal bleeding or bile leakage was noted. There was some bile leakage upon removal of the gallbladder. Surgicel was placed the gallbladder fossa. All fluid and air were then evacuated from the abdominal cavity prior to removal of the trochars.  All wounds were irrigated with normal saline. All wounds were injected with 0.5% Sensorcaine. The supraumbilical fascia was reapproximated using 0 Vicryl interrupted suture. All skin incisions were closed using staples. Betadine ointment and dry sterile dressings were applied.  All tape and needle counts were correct at the end of the procedure. Patient was extubated in the operating room and went back to recovery room awake in stable condition.  Complications:  None  EBL:  Minimal  Specimen:  Gallbladder

## 2011-11-13 NOTE — Anesthesia Postprocedure Evaluation (Signed)
  Anesthesia Post-op Note  Patient: Ariel Ramirez  Procedure(s) Performed: Procedure(s) (LRB): LAPAROSCOPIC CHOLECYSTECTOMY (N/A)  Patient Location: PACU  Anesthesia Type: General  Level of Consciousness: awake, alert  and oriented  Airway and Oxygen Therapy: Patient Spontanous Breathing and Patient connected to face mask oxygen  Post-op Pain: mild  Post-op Assessment: Post-op Vital signs reviewed, Patient's Cardiovascular Status Stable, Respiratory Function Stable and Patent Airway  Post-op Vital Signs: Reviewed and stable  Complications: No apparent anesthesia complications

## 2011-11-13 NOTE — Interval H&P Note (Signed)
History and Physical Interval Note:  11/13/2011 11:57 AM  Marcine Matar  has presented today for surgery, with the diagnosis of Cholelithiasis  The various methods of treatment have been discussed with the patient and family. After consideration of risks, benefits and other options for treatment, the patient has consented to  Procedure(s) (LRB): LAPAROSCOPIC CHOLECYSTECTOMY (N/A) as a surgical intervention .  The patient's history has been reviewed, patient examined, no change in status, stable for surgery.  I have reviewed the patient's chart and labs.  Questions were answered to the patient's satisfaction.     Franky Macho A

## 2011-11-18 ENCOUNTER — Encounter (HOSPITAL_COMMUNITY): Payer: Self-pay | Admitting: General Surgery

## 2012-02-02 ENCOUNTER — Ambulatory Visit: Payer: Federal, State, Local not specified - PPO | Attending: Podiatry | Admitting: Physical Therapy

## 2012-02-02 DIAGNOSIS — M25579 Pain in unspecified ankle and joints of unspecified foot: Secondary | ICD-10-CM | POA: Insufficient documentation

## 2012-02-02 DIAGNOSIS — R5381 Other malaise: Secondary | ICD-10-CM | POA: Insufficient documentation

## 2012-02-02 DIAGNOSIS — IMO0001 Reserved for inherently not codable concepts without codable children: Secondary | ICD-10-CM | POA: Insufficient documentation

## 2012-02-15 ENCOUNTER — Encounter: Payer: Federal, State, Local not specified - PPO | Admitting: Physical Therapy

## 2012-05-24 ENCOUNTER — Ambulatory Visit (HOSPITAL_COMMUNITY)
Admission: RE | Admit: 2012-05-24 | Discharge: 2012-05-24 | Disposition: A | Discharge status: Home / Self Care | Payer: Federal, State, Local not specified - PPO | Attending: Psychiatry | Admitting: Psychiatry

## 2012-05-24 ENCOUNTER — Encounter (HOSPITAL_COMMUNITY): Payer: Self-pay | Admitting: *Deleted

## 2012-05-24 DIAGNOSIS — F3130 Bipolar disorder, current episode depressed, mild or moderate severity, unspecified: Secondary | ICD-10-CM | POA: Insufficient documentation

## 2012-05-24 DIAGNOSIS — F4310 Post-traumatic stress disorder, unspecified: Secondary | ICD-10-CM | POA: Insufficient documentation
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
## 2012-05-25 ENCOUNTER — Encounter (HOSPITAL_COMMUNITY): Payer: Self-pay

## 2012-05-25 ENCOUNTER — Other Ambulatory Visit (HOSPITAL_COMMUNITY): Payer: Federal, State, Local not specified - PPO | Attending: Psychiatry | Admitting: Psychiatry

## 2012-05-25 DIAGNOSIS — F32A Depression, unspecified: Secondary | ICD-10-CM | POA: Insufficient documentation

## 2012-05-25 DIAGNOSIS — F3130 Bipolar disorder, current episode depressed, mild or moderate severity, unspecified: Secondary | ICD-10-CM | POA: Insufficient documentation

## 2012-05-25 DIAGNOSIS — F319 Bipolar disorder, unspecified: Secondary | ICD-10-CM | POA: Insufficient documentation

## 2012-05-25 DIAGNOSIS — M1990 Unspecified osteoarthritis, unspecified site: Secondary | ICD-10-CM | POA: Insufficient documentation

## 2012-05-25 DIAGNOSIS — F4310 Post-traumatic stress disorder, unspecified: Secondary | ICD-10-CM | POA: Insufficient documentation

## 2012-05-25 DIAGNOSIS — F329 Major depressive disorder, single episode, unspecified: Secondary | ICD-10-CM

## 2012-05-25 MED ORDER — CLONAZEPAM 0.5 MG PO TABS: 1.0000 mg | ORAL_TABLET | Freq: Two times a day (BID) | ORAL | Status: DC

## 2012-05-25 MED ORDER — DULOXETINE HCL 20 MG PO CPEP: 20.0000 mg | ORAL_CAPSULE | Freq: Two times a day (BID) | ORAL | Status: DC
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
## 2012-05-25 NOTE — Progress Notes (Signed)
    Daily Group Progress Note  Program: IOP  Group Time: 9:00-10:30 am   Participation Level: None  Behavioral Response: none  Type of Therapy:  Process Group  Summary of Progress: Today was patients first day in the group. She was not present for this portion due to completing the initial assessment and orientation portion of the group.      Group Time: 10:30 am - 12:00 pm   Participation Level:  Active  Behavioral Response: Appropriate  Type of Therapy: Psycho-education Group  Summary of Progress: Patient learned more about the DBT skill of distress tolerance and how to use it to manage stressors.   Carman Ching, LCSW

## 2012-05-26 ENCOUNTER — Other Ambulatory Visit (HOSPITAL_COMMUNITY): Payer: Federal, State, Local not specified - PPO | Attending: Psychiatry | Admitting: Psychiatry

## 2012-05-26 DIAGNOSIS — F3130 Bipolar disorder, current episode depressed, mild or moderate severity, unspecified: Secondary | ICD-10-CM | POA: Insufficient documentation

## 2012-05-26 DIAGNOSIS — F4310 Post-traumatic stress disorder, unspecified: Secondary | ICD-10-CM | POA: Insufficient documentation

## 2012-05-26 DIAGNOSIS — F319 Bipolar disorder, unspecified: Secondary | ICD-10-CM
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
## 2012-05-27 ENCOUNTER — Other Ambulatory Visit (HOSPITAL_COMMUNITY): Payer: Federal, State, Local not specified - PPO | Attending: Psychiatry | Admitting: Psychiatry

## 2012-05-27 DIAGNOSIS — F319 Bipolar disorder, unspecified: Secondary | ICD-10-CM

## 2012-05-27 DIAGNOSIS — F4310 Post-traumatic stress disorder, unspecified: Secondary | ICD-10-CM | POA: Insufficient documentation

## 2012-05-27 DIAGNOSIS — F3130 Bipolar disorder, current episode depressed, mild or moderate severity, unspecified: Secondary | ICD-10-CM | POA: Insufficient documentation

## 2012-05-27 NOTE — Progress Notes (Signed)
    Daily Group Progress Note  Program: IOP  Group Time: 9:00-10:30 am   Participation Level: Active  Behavioral Response: Appropriate  Type of Therapy:  Process Group  Summary of Progress: Patient reports high anxiety today and having bad dreams in the night. She became tearful when talking about missing work and how that was her main form of support. She said she misses being there and feels that is the only place where things are "ok".      Group Time: 10:30 am - 12:00 pm   Participation Level:  Active  Behavioral Response: Appropriate  Type of Therapy: Psycho-education Group  Summary of Progress: Patient learned about anger and how to manage anger effectively.   Carman Ching, LCSW

## 2012-05-30 ENCOUNTER — Other Ambulatory Visit (HOSPITAL_COMMUNITY): Payer: Federal, State, Local not specified - PPO | Attending: Psychiatry

## 2012-05-31 ENCOUNTER — Other Ambulatory Visit (HOSPITAL_COMMUNITY): Payer: Federal, State, Local not specified - PPO | Attending: Psychiatry | Admitting: Psychiatry

## 2012-05-31 DIAGNOSIS — F3130 Bipolar disorder, current episode depressed, mild or moderate severity, unspecified: Secondary | ICD-10-CM | POA: Insufficient documentation

## 2012-05-31 DIAGNOSIS — F319 Bipolar disorder, unspecified: Secondary | ICD-10-CM
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
## 2012-06-01 ENCOUNTER — Other Ambulatory Visit (HOSPITAL_COMMUNITY): Payer: Federal, State, Local not specified - PPO | Attending: Psychiatry | Admitting: Psychiatry

## 2012-06-01 DIAGNOSIS — F4310 Post-traumatic stress disorder, unspecified: Secondary | ICD-10-CM | POA: Insufficient documentation

## 2012-06-01 DIAGNOSIS — F319 Bipolar disorder, unspecified: Secondary | ICD-10-CM

## 2012-06-01 DIAGNOSIS — F3130 Bipolar disorder, current episode depressed, mild or moderate severity, unspecified: Secondary | ICD-10-CM | POA: Insufficient documentation

## 2012-06-01 NOTE — Progress Notes (Signed)
    Daily Group Progress Note  Program: IOP  Group Time: 9:00-10:30 am   Participation Level: Active  Behavioral Response: Appropriate  Type of Therapy:  Process Group  Summary of Progress: Patient still reports high anxiety and is still driven daily to group by her husband due to her inability to drive from her high anxiety. Patient presents with traits of codependency within the group. She struggles to talk about herself and focuses on the needs of the other group members. She also seeks the approval and ideas from others instead of being able to come up with them on her own. When this was brought to her attention today she said. "I have always been more of a follower". Patient is working on improving self-esteem, processing trauma from childhood and effective anxiety coping strategies.      Group Time: 10:30 am - 12:00 pm   Participation Level:  Active  Behavioral Response: Appropriate  Type of Therapy: Psycho-education Group  Summary of Progress: Patient was not present for this group due to completing the initial orientation and assessment process.     Carman Ching, LCSW

## 2012-06-02 ENCOUNTER — Other Ambulatory Visit (HOSPITAL_COMMUNITY): Payer: Federal, State, Local not specified - PPO | Attending: Psychiatry | Admitting: Psychiatry

## 2012-06-02 DIAGNOSIS — F411 Generalized anxiety disorder: Secondary | ICD-10-CM | POA: Insufficient documentation

## 2012-06-02 DIAGNOSIS — F332 Major depressive disorder, recurrent severe without psychotic features: Secondary | ICD-10-CM | POA: Insufficient documentation

## 2012-06-02 DIAGNOSIS — F4310 Post-traumatic stress disorder, unspecified: Secondary | ICD-10-CM | POA: Insufficient documentation

## 2012-06-02 DIAGNOSIS — F319 Bipolar disorder, unspecified: Secondary | ICD-10-CM
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
## 2012-06-02 NOTE — Progress Notes (Signed)
    Daily Group Progress Note  Program: IOP  Group Time: 9:00-10:45  Participation Level: Active  Behavioral Response: Appropriate and Sharing  Type of Therapy:  Group Therapy  Summary of Progress: Pt. Shared themes related to developing healthy boundaries.     Group Time: 11:00-12:00  Participation Level:  Active  Behavioral Response: Appropriate and Sharing  Type of Therapy: Psycho-education Group  Summary of Progress: Pt. Participated in guided visualization exercise.  [facility]

## 2012-06-03 ENCOUNTER — Other Ambulatory Visit (HOSPITAL_COMMUNITY): Payer: Federal, State, Local not specified - PPO | Attending: Psychiatry | Admitting: Psychiatry

## 2012-06-03 DIAGNOSIS — F319 Bipolar disorder, unspecified: Secondary | ICD-10-CM

## 2012-06-03 DIAGNOSIS — F411 Generalized anxiety disorder: Secondary | ICD-10-CM | POA: Insufficient documentation

## 2012-06-03 DIAGNOSIS — F332 Major depressive disorder, recurrent severe without psychotic features: Secondary | ICD-10-CM | POA: Insufficient documentation

## 2012-06-03 DIAGNOSIS — F4310 Post-traumatic stress disorder, unspecified: Secondary | ICD-10-CM | POA: Insufficient documentation

## 2012-06-06 ENCOUNTER — Other Ambulatory Visit (HOSPITAL_COMMUNITY): Payer: Federal, State, Local not specified - PPO | Attending: Psychiatry | Admitting: Psychiatry

## 2012-06-06 DIAGNOSIS — F332 Major depressive disorder, recurrent severe without psychotic features: Secondary | ICD-10-CM | POA: Insufficient documentation

## 2012-06-06 DIAGNOSIS — F319 Bipolar disorder, unspecified: Secondary | ICD-10-CM

## 2012-06-06 DIAGNOSIS — F411 Generalized anxiety disorder: Secondary | ICD-10-CM | POA: Insufficient documentation

## 2012-06-06 DIAGNOSIS — F4310 Post-traumatic stress disorder, unspecified: Secondary | ICD-10-CM | POA: Insufficient documentation

## 2012-06-06 NOTE — Progress Notes (Signed)
    Daily Group Progress Note  Program: IOP  Group Time: 9:00-10:30 am   Participation Level: Active  Behavioral Response: Appropriate  Type of Therapy:  Process Group  Summary of Progress: Patient was talkative today and shared and connected with other members. She talked about her long history of depression since childhood and how she has to hide it from others who don't understand depression.      Group Time: 10:30 am - 12:00 pm   Participation Level:  Active  Behavioral Response: Appropriate  Type of Therapy: Psycho-education Group  Summary of Progress: Patient participated in a group with a focus on grief and loss and identified losses impacting overall wellness and effective ways to grieve.   Carman Ching, LCSW

## 2012-06-07 ENCOUNTER — Other Ambulatory Visit (HOSPITAL_COMMUNITY): Payer: Federal, State, Local not specified - PPO | Attending: Psychiatry | Admitting: Psychiatry

## 2012-06-07 DIAGNOSIS — F411 Generalized anxiety disorder: Secondary | ICD-10-CM | POA: Insufficient documentation

## 2012-06-07 DIAGNOSIS — F332 Major depressive disorder, recurrent severe without psychotic features: Secondary | ICD-10-CM | POA: Insufficient documentation

## 2012-06-07 DIAGNOSIS — F410 Panic disorder [episodic paroxysmal anxiety] without agoraphobia: Secondary | ICD-10-CM | POA: Insufficient documentation

## 2012-06-07 DIAGNOSIS — F319 Bipolar disorder, unspecified: Secondary | ICD-10-CM

## 2012-06-07 DIAGNOSIS — F4310 Post-traumatic stress disorder, unspecified: Secondary | ICD-10-CM | POA: Insufficient documentation

## 2012-06-07 NOTE — Progress Notes (Signed)
Discharge Note  Patient:  Ariel Ramirez is an 52 y.o., female DOB:  12-18-1960  Date of Admission:  05/25/12  Date of Discharge:  06/07/12  Reason for Admission: depression, and anxiety.  Hospital Course: Patient started IOP and her Cymbalta was increased to 20 mg twice a day and because of her anxiety her Klonopin was increased to 0.5 mg q. a.m. and 1 mg at at bedtime. She was continued on her Wellbutrin, Lamictal, Neurontin and Abilify. Patient began attending groups and began opening up about her stressors and her fears and was able to accept positive feet. Patient gradually stabilized with improved sleep and appetite and better mood with significant decrease in anxiety. She is tolerating her medications well and coping well. Discussed graded exposure and desensitization as part of her return to work and patient was willing to to.  Mental Status at Discharge: Alert, oriented x3, affect was full   mood was mildly anxious, speech was normal no suicidal or homicidal ideation. No hallucinations or delusions. Recent and remote memory is good, judgment and insight is good, concentration and recall is good.  Lab Results: No results found for this or any previous visit (from the past 48 hour(s)).  Current outpatient prescriptions:aspirin 81 MG tablet, Take 81 mg by mouth daily., Disp: , Rfl: ;  buPROPion (WELLBUTRIN XL) 150 MG 24 hr tablet, Take 150 mg by mouth daily., Disp: , Rfl: ;  clonazePAM (KLONOPIN) 0.5 MG tablet, Take 2 tablets (1 mg total) by mouth 2 (two) times daily. Anxiety, Disp: 30 tablet, Rfl: 0;  Coenzyme Q10 200 MG capsule, Take 200 mg by mouth daily., Disp: , Rfl:  DULoxetine (CYMBALTA) 20 MG capsule, Take 1 capsule (20 mg total) by mouth 2 (two) times daily., Disp: 60 capsule, Rfl: 9;  esomeprazole (NEXIUM) 40 MG capsule, Take 40 mg by mouth daily before breakfast., Disp: , Rfl: ;  estradiol (ESTRACE) 0.5 MG tablet, Take 0.5 mg by mouth daily., Disp: , Rfl: ;  fish oil-omega-3 fatty  acids 1000 MG capsule, Take 1 g by mouth daily., Disp: , Rfl:  gabapentin (NEURONTIN) 300 MG capsule, Take 300 mg by mouth daily as needed. Anxiety Attacks, Disp: , Rfl: ;  lamoTRIgine (LAMICTAL) 100 MG tablet, Take 100 mg by mouth 2 (two) times daily. , Disp: , Rfl: ;  levothyroxine (SYNTHROID, LEVOTHROID) 112 MCG tablet, Take 112 mcg by mouth every other day. Alternate with 125 mg, Disp: , Rfl:  levothyroxine (SYNTHROID, LEVOTHROID) 125 MCG tablet, Take 125 mcg by mouth every other day. Alternate with 112 mcg, Disp: , Rfl: ;  lubiprostone (AMITIZA) 24 MCG capsule, Take 24 mcg by mouth daily as needed. Constipation, Disp: , Rfl: ;  Melaton-Thean-Cham-PassF-LBalm (MELATONIN + L-THEANINE PO), Take 1 tablet by mouth at bedtime., Disp: , Rfl: ;  Selenium (SELENICAPS-200 PO), Take 1 capsule by mouth daily., Disp: , Rfl:  SUMAtriptan (IMITREX) 100 MG tablet, Take 100 mg by mouth 2 (two) times daily. Migraine, Disp: , Rfl: ;  thiamine (VITAMIN B-1) 100 MG tablet, Take 100 mg by mouth daily., Disp: , Rfl: ;  verapamil (CALAN-SR) 180 MG CR tablet, Take 180 mg by mouth at bedtime as needed. Headache, Disp: , Rfl:  Abilify 5 mg po q am. Axis Diagnosis:   Axis I: Generalized Anxiety Disorder, Major Depression, Recurrent severe, Panic Disorder and Post Traumatic Stress Disorder Axis II: Deferred Axis III:  Past Medical History  Diagnosis Date  . Hypothyroid   . Anxiety   . Depression   .  Arthritis    Axis IV: occupational problems, problems related to social environment and problems with primary support group Axis V: 61-70 mild symptoms   Level of Care:  OP  Discharge destination:  Home  Is patient on multiple antipsychotic therapies at discharge:  No    Has Patient had three or more failed trials of antipsychotic monotherapy by history:  No  Patient phone:  [phone] (home)  Patient address:   [address] [place] [address],   Follow-up recommendations:  Activity:  as tolerated Diet:   Regular Other:  Follow up for meds with Saul Fordyce RNP for meds and Tonny Bollman for therapy    The patient received suicide prevention pamphlet:  No   Margit Banda 06/07/2012, 12:17 PM

## 2012-06-07 NOTE — Patient Instructions (Signed)
Patient completed MH-IOP today.  Will follow up with Ollen Gross, LPC on 06-17-12 @ 9am and Saul Fordyce, NP on 06-23-12 @ 1:40pm.  Encouraged support groups.
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
## 2012-06-08 ENCOUNTER — Other Ambulatory Visit (HOSPITAL_COMMUNITY): Payer: Self-pay

## 2012-06-08 NOTE — Progress Notes (Signed)
    Daily Group Progress Note  Program: IOP  Group Time: 9:00-10:30 am   Participation Level: Active  Behavioral Response: Appropriate  Type of Therapy:  Process Group  Summary of Progress: Today was patients final day in the group. She reported minimal progress in the group but members pointed out areas of change. Patient drove herself to the group for the first time today without needing her husband to transport and she has made progress with identifying traumas from her past and how they could be impacting her today. Patient struggles with understanding the causes of her depression and anxiety, but others in the group could easily identify it.      Group Time: 10:30 am - 12:00 pm   Participation Level:  Active  Behavioral Response: Appropriate  Type of Therapy: Psycho-education Group  Summary of Progress: Patient participated in an activity to reduce anxiety and practiced Progressive Muscle Relaxation to reduce anxiety symptoms.   Carman Ching, LCSW

## 2012-06-09 ENCOUNTER — Other Ambulatory Visit (HOSPITAL_COMMUNITY): Payer: Self-pay

## 2012-06-10 ENCOUNTER — Other Ambulatory Visit (HOSPITAL_COMMUNITY): Payer: Self-pay

## 2012-06-13 ENCOUNTER — Other Ambulatory Visit (HOSPITAL_COMMUNITY): Payer: Self-pay

## 2012-06-14 ENCOUNTER — Other Ambulatory Visit (HOSPITAL_COMMUNITY): Payer: Self-pay

## 2012-06-15 ENCOUNTER — Other Ambulatory Visit (HOSPITAL_COMMUNITY): Payer: Self-pay

## 2012-06-16 ENCOUNTER — Other Ambulatory Visit (HOSPITAL_COMMUNITY): Payer: Self-pay

## 2012-06-17 ENCOUNTER — Other Ambulatory Visit (HOSPITAL_COMMUNITY): Payer: Self-pay

## 2012-06-20 ENCOUNTER — Other Ambulatory Visit (HOSPITAL_COMMUNITY): Payer: Self-pay

## 2012-06-21 ENCOUNTER — Other Ambulatory Visit (HOSPITAL_COMMUNITY): Payer: Self-pay

## 2012-06-22 ENCOUNTER — Other Ambulatory Visit (HOSPITAL_COMMUNITY): Payer: Self-pay

## 2012-06-23 ENCOUNTER — Other Ambulatory Visit (HOSPITAL_COMMUNITY): Payer: Self-pay

## 2012-06-24 ENCOUNTER — Other Ambulatory Visit (HOSPITAL_COMMUNITY): Payer: Self-pay

## 2012-06-27 ENCOUNTER — Other Ambulatory Visit (HOSPITAL_COMMUNITY): Payer: Self-pay

## 2012-06-28 ENCOUNTER — Other Ambulatory Visit (HOSPITAL_COMMUNITY): Payer: Self-pay

## 2012-06-29 ENCOUNTER — Other Ambulatory Visit (HOSPITAL_COMMUNITY): Payer: Self-pay

## 2012-06-29 ENCOUNTER — Ambulatory Visit (INDEPENDENT_AMBULATORY_CARE_PROVIDER_SITE_OTHER): Payer: Federal, State, Local not specified - PPO | Admitting: Internal Medicine

## 2012-06-29 ENCOUNTER — Encounter (INDEPENDENT_AMBULATORY_CARE_PROVIDER_SITE_OTHER): Payer: Self-pay | Admitting: Internal Medicine

## 2012-06-29 VITALS — BP 100/70 | Temp 98.8°F | Ht 66.0 in | Wt 163.5 lb

## 2012-06-29 DIAGNOSIS — R1013 Epigastric pain: Secondary | ICD-10-CM

## 2012-06-29 DIAGNOSIS — G8929 Other chronic pain: Secondary | ICD-10-CM

## 2012-06-29 DIAGNOSIS — K5900 Constipation, unspecified: Secondary | ICD-10-CM

## 2012-06-29 MED ORDER — LINACLOTIDE 290 MCG PO CAPS: 290.0000 ug | ORAL_CAPSULE | ORAL | Status: DC

## 2012-06-29 NOTE — Patient Instructions (Addendum)
OV in 3 months. Samples of Linzess once a day. PR in 2 weeks

## 2012-06-29 NOTE — Progress Notes (Signed)
Subjective:     Patient ID: Ariel Ramirez, female   DOB: Feb 12, 1961, 52 y.o.   MRN: [id number]  HPIPresents today with c/o epigastric pain.  She will feel the pain spread. She tells me she has been having acid reflux once every 3 days. Worse at night.  She tells me she is eating apple sauce and yogart because of the pain. She also has bloating. She tkes Amitiza and stool softners which do not help. She tells me she is lucky to have a BM once a week.  No melena or bright red rectal bleeding.   10/2011 EGD: Small sliding hiatal hernia otherwise normal EGD.  She underwent a cholecystectomy  by Dr. Lovell Sheehan for this same pain.  10/05/2011 CT abdomen/pelvis with CM for cosntipation and rt upper quadrant pain: 1. No acute process in the abdomen or pelvis.  2. Possible constipation.   Review of Systems Current Outpatient Prescriptions  Medication Sig Dispense Refill  . aspirin 81 MG tablet Take 81 mg by mouth daily.      Marland Kitchen buPROPion (WELLBUTRIN XL) 150 MG 24 hr tablet Take 300 mg by mouth daily.       . clonazePAM (KLONOPIN) 0.5 MG tablet Take 2 tablets (1 mg total) by mouth 2 (two) times daily. Anxiety  30 tablet  0  . Coenzyme Q10 200 MG capsule Take 200 mg by mouth daily.      . DULoxetine (CYMBALTA) 20 MG capsule Take 1 capsule (20 mg total) by mouth 2 (two) times daily.  60 capsule  9  . esomeprazole (NEXIUM) 40 MG capsule Take 40 mg by mouth daily before breakfast.      . estradiol (ESTRACE) 0.5 MG tablet Take 0.5 mg by mouth daily.      Marland Kitchen gabapentin (NEURONTIN) 300 MG capsule Take 300 mg by mouth daily as needed. Anxiety Attacks      . lamoTRIgine (LAMICTAL) 100 MG tablet Take 100 mg by mouth 2 (two) times daily.       Marland Kitchen levothyroxine (SYNTHROID, LEVOTHROID) 112 MCG tablet Take 112 mcg by mouth every other day. Alternate with 125 mg      . levothyroxine (SYNTHROID, LEVOTHROID) 125 MCG tablet Take 125 mcg by mouth every other day. Alternate with 112 mcg      . Linaclotide 290 MCG CAPS  Take 290 mcg by mouth 1 day or 1 dose.  30 capsule  4  . lubiprostone (AMITIZA) 24 MCG capsule Take 24 mcg by mouth daily as needed. Constipation      . Melaton-Thean-Cham-PassF-LBalm (MELATONIN + L-THEANINE PO) Take 1 tablet by mouth at bedtime.      . SUMAtriptan (IMITREX) 100 MG tablet Take 100 mg by mouth every 2 (two) hours as needed. Migraine      . verapamil (CALAN-SR) 180 MG CR tablet Take 180 mg by mouth at bedtime as needed. 200mg  for her Headache       No current facility-administered medications for this visit.   Past Medical History  Diagnosis Date  . Hypothyroid   . Anxiety   . Depression   . Arthritis    Past Surgical History  Procedure Laterality Date  . Partial hysterectomy      2011  . Tubal ligation      20 yrs ago  . Esophagogastroduodenoscopy  11/09/2011    Procedure: ESOPHAGOGASTRODUODENOSCOPY (EGD);  Surgeon: Malissa Hippo, MD;  Location: [facility] ENDO SUITE;  Service: Endoscopy;  Laterality: N/A;  730  . Cholecystectomy  11/13/2011  Procedure: LAPAROSCOPIC CHOLECYSTECTOMY;  Surgeon: Dalia Heading, MD;  Location: [facility] ORS;  Service: General;  Laterality: N/A;   No Known Allergies     Objective:   Physical Exam  Filed Vitals:   06/29/12 1547  BP: 100/70  Temp: 98.8 F (37.1 C)  Height: 5\' 6"  (1.676 m)  Weight: 163 lb 8 oz (74.163 kg)        Assessment:    Constipation. Amitiza is not working now.  Epigastric pain occasionally. She describes this as same type as when her gallbladder was removed. No pain today. Suspect her symptoms are related to her constipation.     Plan:    Samples of Linzess . Take one a day. OV in 3 months. PR in 2 weeks.

## 2012-09-28 ENCOUNTER — Ambulatory Visit (INDEPENDENT_AMBULATORY_CARE_PROVIDER_SITE_OTHER): Payer: Self-pay | Admitting: Internal Medicine

## 2012-12-22 ENCOUNTER — Ambulatory Visit (INDEPENDENT_AMBULATORY_CARE_PROVIDER_SITE_OTHER): Payer: Federal, State, Local not specified - PPO | Admitting: Internal Medicine

## 2012-12-22 ENCOUNTER — Encounter (INDEPENDENT_AMBULATORY_CARE_PROVIDER_SITE_OTHER): Payer: Self-pay | Admitting: Internal Medicine

## 2012-12-22 VITALS — BP 110/72 | HR 64 | Temp 97.8°F | Ht 66.0 in | Wt 162.5 lb

## 2012-12-22 DIAGNOSIS — K5900 Constipation, unspecified: Secondary | ICD-10-CM | POA: Insufficient documentation

## 2012-12-22 DIAGNOSIS — R110 Nausea: Secondary | ICD-10-CM

## 2012-12-22 LAB — CBC WITH DIFFERENTIAL/PLATELET
Basophils Absolute: 0 10*3/uL (ref 0.0–0.1)
Basophils Relative: 0 % (ref 0–1)
Eosinophils Absolute: 0.1 10*3/uL (ref 0.0–0.7)
Eosinophils Relative: 2 % (ref 0–5)
HCT: 38.9 % (ref 36.0–46.0)
Hemoglobin: 13.6 g/dL (ref 12.0–15.0)
Lymphocytes Relative: 52 % — ABNORMAL HIGH (ref 12–46)
Lymphs Abs: 1.8 10*3/uL (ref 0.7–4.0)
MCH: 30.5 pg (ref 26.0–34.0)
MCHC: 35 g/dL (ref 30.0–36.0)
MCV: 87.2 fL (ref 78.0–100.0)
Monocytes Absolute: 0.3 10*3/uL (ref 0.1–1.0)
Monocytes Relative: 10 % (ref 3–12)
Neutro Abs: 1.2 10*3/uL — ABNORMAL LOW (ref 1.7–7.7)
Neutrophils Relative %: 36 % — ABNORMAL LOW (ref 43–77)
Platelets: 244 10*3/uL (ref 150–400)
RBC: 4.46 MIL/uL (ref 3.87–5.11)
RDW: 13.3 % (ref 11.5–15.5)
WBC: 3.4 10*3/uL — ABNORMAL LOW (ref 4.0–10.5)

## 2012-12-22 LAB — COMPREHENSIVE METABOLIC PANEL WITH GFR
ALT: 12 U/L (ref 0–35)
AST: 13 U/L (ref 0–37)
Albumin: 4.1 g/dL (ref 3.5–5.2)
Alkaline Phosphatase: 47 U/L (ref 39–117)
BUN: 15 mg/dL (ref 6–23)
CO2: 28 meq/L (ref 19–32)
Calcium: 9.2 mg/dL (ref 8.4–10.5)
Chloride: 105 meq/L (ref 96–112)
Creat: 0.78 mg/dL (ref 0.50–1.10)
Glucose, Bld: 82 mg/dL (ref 70–99)
Potassium: 4.4 meq/L (ref 3.5–5.3)
Sodium: 142 meq/L (ref 135–145)
Total Bilirubin: 0.4 mg/dL (ref 0.3–1.2)
Total Protein: 6.5 g/dL (ref 6.0–8.3)

## 2012-12-22 MED ORDER — ONDANSETRON HCL 4 MG PO TABS: 4.0000 mg | ORAL_TABLET | Freq: Three times a day (TID) | ORAL | Status: DC | PRN

## 2012-12-22 NOTE — Progress Notes (Signed)
Subjective:     Patient ID: Ariel Ramirez, female   DOB: February 21, 1961, 52 y.o.   MRN: [id number]  HPI Here today for f/u of her constipation. She is having a BM once a week. The Linzess is really not helping. She takes laxatives which really do not help. Amitiza makes her sick. She presently is using [employer] which really has not helped.  She also has nausea.  She had a sinus infection which she thinks caused it.  She has not had a BM in over a week.  She tells me she has been constipated her whole life. Appetite is not good. She has nausea.  She has had nausea for about a month.  No weight loss. No melena or bright red rectal bleeding.    Last seen in April of this year for constipation. Linzess samples given to patient. She was to call with a PR in 2 weeks but did not.  10/2011 EGD: Impression:  Small sliding hiatal hernia otherwise normal EGD.     Review of Systems Current Outpatient Prescriptions  Medication Sig Dispense Refill  . buPROPion (WELLBUTRIN XL) 150 MG 24 hr tablet Take 300 mg by mouth daily.       . clonazePAM (KLONOPIN) 0.5 MG tablet Take 1 mg by mouth 2 (two) times daily as needed. Anxiety      . DULoxetine (CYMBALTA) 20 MG capsule Take 1 capsule (20 mg total) by mouth 2 (two) times daily.  60 capsule  9  . esomeprazole (NEXIUM) 40 MG capsule Take 40 mg by mouth daily before breakfast.      . estradiol (ESTRACE) 0.5 MG tablet Take 0.5 mg by mouth daily.      Marland Kitchen gabapentin (NEURONTIN) 300 MG capsule Take 300 mg by mouth daily as needed. Anxiety Attacks      . lamoTRIgine (LAMICTAL) 100 MG tablet Take 100 mg by mouth 2 (two) times daily.       Marland Kitchen levothyroxine (SYNTHROID, LEVOTHROID) 112 MCG tablet Take 112 mcg by mouth every other day. Alternate with 125 mg      . levothyroxine (SYNTHROID, LEVOTHROID) 125 MCG tablet Take 125 mcg by mouth every other day. Alternate with 112 mcg      . Linaclotide 290 MCG CAPS Take 290 mcg by mouth 1 day or 1 dose.  30 capsule  4  .  lubiprostone (AMITIZA) 24 MCG capsule Take 24 mcg by mouth daily as needed. Constipation      . Melaton-Thean-Cham-PassF-LBalm (MELATONIN + L-THEANINE PO) Take 1 tablet by mouth at bedtime.      . ondansetron (ZOFRAN) 4 MG tablet Take 1 tablet (4 mg total) by mouth every 8 (eight) hours as needed for nausea.  30 tablet  1  . verapamil (CALAN-SR) 180 MG CR tablet Take 180 mg by mouth at bedtime as needed. 200mg  for her Headache      . Vortioxetine HBr (BRINTELLIX) 10 MG TABS Take by mouth.      . zolpidem (AMBIEN CR) 12.5 MG CR tablet Take 12.5 mg by mouth at bedtime as needed for sleep.      Marland Kitchen aspirin 81 MG tablet Take 81 mg by mouth daily.       No current facility-administered medications for this visit.   Past Medical History  Diagnosis Date  . Hypothyroid   . Anxiety   . Depression   . Arthritis    Past Surgical History  Procedure Laterality Date  . Partial hysterectomy  2011  . Tubal ligation      20 yrs ago  . Esophagogastroduodenoscopy  11/09/2011    Procedure: ESOPHAGOGASTRODUODENOSCOPY (EGD);  Surgeon: Malissa Hippo, MD;  Location: [facility];  Service: Endoscopy;  Laterality: N/A;  730  . Cholecystectomy  11/13/2011    Procedure: LAPAROSCOPIC CHOLECYSTECTOMY;  Surgeon: Dalia Heading, MD;  Location: [facility];  Service: General;  Laterality: N/A;   No Known Allergies      Objective:   Physical Exam  Filed Vitals:   12/22/12 1045  BP: 110/72  Pulse: 64  Temp: 97.8 F (36.6 C)  Height: 5\' 6"  (1.676 m)  Weight: 162 lb 8 oz (73.71 kg)  Alert and oriented. Skin warm and dry. Oral mucosa is moist.   . Sclera anicteric, conjunctivae is pink. Thyroid not enlarged. No cervical lymphadenopathy. Lungs clear. Heart regular rate and rhythm.  Abdomen is soft. Bowel sounds are positive. No hepatomegaly. No abdominal masses felt. No tenderness.  No edema to lower extremities.      Assessment:     Chronic constipation. She was intolerant of Amitiza. She has tried multiple  medications without much luck. Nausea which I suspect is residual from her sinus infection    Plan:    Zofran prn. E prescribed to her pharmacy. Advised to take Linzess daily.  OV in 1 month. CBC and Cmet today.

## 2012-12-22 NOTE — Patient Instructions (Addendum)
Linzess every other day.  Zofran prn.  OV in 2 month

## 2013-01-02 ENCOUNTER — Telehealth (INDEPENDENT_AMBULATORY_CARE_PROVIDER_SITE_OTHER): Payer: Self-pay | Admitting: Internal Medicine

## 2013-01-02 DIAGNOSIS — R531 Weakness: Secondary | ICD-10-CM

## 2013-01-02 NOTE — Telephone Encounter (Signed)
I have spoken with patient 

## 2013-01-03 LAB — CBC WITH DIFFERENTIAL/PLATELET
Basophils Absolute: 0 10*3/uL (ref 0.0–0.1)
Basophils Relative: 0 % (ref 0–1)
HCT: 37.8 % (ref 36.0–46.0)
Hemoglobin: 13.1 g/dL (ref 12.0–15.0)
Lymphocytes Relative: 51 % — ABNORMAL HIGH (ref 12–46)
MCHC: 34.7 g/dL (ref 30.0–36.0)
Neutro Abs: 1.4 10*3/uL — ABNORMAL LOW (ref 1.7–7.7)
Neutrophils Relative %: 38 % — ABNORMAL LOW (ref 43–77)
RDW: 13.4 % (ref 11.5–15.5)
WBC: 3.9 10*3/uL — ABNORMAL LOW (ref 4.0–10.5)

## 2013-01-09 ENCOUNTER — Telehealth (INDEPENDENT_AMBULATORY_CARE_PROVIDER_SITE_OTHER): Payer: Self-pay | Admitting: Internal Medicine

## 2013-01-09 NOTE — Telephone Encounter (Signed)
Message left at home 

## 2013-01-10 ENCOUNTER — Telehealth (INDEPENDENT_AMBULATORY_CARE_PROVIDER_SITE_OTHER): Payer: Self-pay | Admitting: *Deleted

## 2013-01-10 NOTE — Telephone Encounter (Signed)
Results given to patient .She feels better. Linzess is helping. Will get a repeat CBC in one month.

## 2013-01-10 NOTE — Telephone Encounter (Signed)
Would like her lab order faxed to Dr. Joyce Copa office. The return phone number is [phone].

## 2013-01-11 ENCOUNTER — Encounter (INDEPENDENT_AMBULATORY_CARE_PROVIDER_SITE_OTHER): Payer: Self-pay | Admitting: *Deleted

## 2013-01-11 ENCOUNTER — Telehealth (INDEPENDENT_AMBULATORY_CARE_PROVIDER_SITE_OTHER): Payer: Self-pay | Admitting: *Deleted

## 2013-01-11 DIAGNOSIS — R531 Weakness: Secondary | ICD-10-CM

## 2013-01-11 NOTE — Telephone Encounter (Signed)
.  Per Delrae Rend ,patient to have labs done in 1 month.

## 2013-01-17 ENCOUNTER — Telehealth (INDEPENDENT_AMBULATORY_CARE_PROVIDER_SITE_OTHER): Payer: Self-pay | Admitting: *Deleted

## 2013-01-17 NOTE — Telephone Encounter (Signed)
Called and requested her lab orders be faxed to Dr. Joyce Copa office at [phone]. Has been faxed and confirmation was received.

## 2013-01-19 LAB — CBC
Hemoglobin: 13 g/dL (ref 12.0–15.0)
Platelets: 210 10*3/uL (ref 150–400)
RBC: 4.32 MIL/uL (ref 3.87–5.11)
WBC: 4.5 10*3/uL (ref 4.0–10.5)

## 2013-01-24 ENCOUNTER — Ambulatory Visit (INDEPENDENT_AMBULATORY_CARE_PROVIDER_SITE_OTHER): Payer: Federal, State, Local not specified - PPO | Admitting: Internal Medicine

## 2013-01-24 ENCOUNTER — Encounter (INDEPENDENT_AMBULATORY_CARE_PROVIDER_SITE_OTHER): Payer: Self-pay | Admitting: Internal Medicine

## 2013-01-24 VITALS — BP 132/70 | HR 60 | Temp 98.6°F | Ht 66.0 in | Wt 165.0 lb

## 2013-01-24 DIAGNOSIS — K5900 Constipation, unspecified: Secondary | ICD-10-CM

## 2013-01-24 DIAGNOSIS — R110 Nausea: Secondary | ICD-10-CM

## 2013-01-24 LAB — CBC WITH DIFFERENTIAL/PLATELET
Basophils Absolute: 0 10*3/uL (ref 0.0–0.1)
Eosinophils Relative: 2 % (ref 0–5)
HCT: 37.1 % (ref 36.0–46.0)
Hemoglobin: 12.8 g/dL (ref 12.0–15.0)
Lymphocytes Relative: 53 % — ABNORMAL HIGH (ref 12–46)
Lymphs Abs: 2 10*3/uL (ref 0.7–4.0)
MCV: 87.3 fL (ref 78.0–100.0)
Monocytes Absolute: 0.4 10*3/uL (ref 0.1–1.0)
Monocytes Relative: 10 % (ref 3–12)
Neutro Abs: 1.4 10*3/uL — ABNORMAL LOW (ref 1.7–7.7)
RBC: 4.25 MIL/uL (ref 3.87–5.11)
RDW: 13 % (ref 11.5–15.5)
WBC: 3.9 10*3/uL — ABNORMAL LOW (ref 4.0–10.5)

## 2013-01-24 NOTE — Patient Instructions (Signed)
CBC with diff. OV in 6 months.

## 2013-01-24 NOTE — Progress Notes (Signed)
Subjective:     Patient ID: Ariel Ramirez, female   DOB: [birth year], 52 y.o.   MRN: [id number]  HPI Here today for f/u. She says a few weeks ago she was very nauseated. She had the dry heaves. This is better now.  She also says her constipation is much better. She is taking Linzess every other day.  She has a BM about twice a week . She will have diarrhea. She did have a normal BM yesterday.  Her appetite is good. No weight loss. No melena or bright red rectal bleeding.  She tells me she is trying to get disablilty.  10/2011 EGD: Impression:  Small sliding hiatal hernia otherwise normal EGD.      CBC    Component Value Date/Time   WBC 4.5 01/19/2013 0856   RBC 4.32 01/19/2013 0856   HGB 13.0 01/19/2013 0856   HCT 37.0 01/19/2013 0856   PLT 210 01/19/2013 0856   MCV 85.6 01/19/2013 0856   MCH 30.1 01/19/2013 0856   MCHC 35.1 01/19/2013 0856   RDW 12.9 01/19/2013 0856   LYMPHSABS 2.0 01/02/2013 1138   MONOABS 0.4 01/02/2013 1138   EOSABS 0.1 01/02/2013 1138   BASOSABS 0.0 01/02/2013 1138   CMP     Component Value Date/Time   NA 142 12/22/2012 1139   K 4.4 12/22/2012 1139   CL 105 12/22/2012 1139   CO2 28 12/22/2012 1139   GLUCOSE 82 12/22/2012 1139   BUN 15 12/22/2012 1139   CREATININE 0.78 12/22/2012 1139   CREATININE 0.72 11/12/2011 1430   CALCIUM 9.2 12/22/2012 1139   CALCIUM 9.2 01/22/2010 2224   PROT 6.5 12/22/2012 1139   ALBUMIN 4.1 12/22/2012 1139   AST 13 12/22/2012 1139   ALT 12 12/22/2012 1139   ALKPHOS 47 12/22/2012 1139   BILITOT 0.4 12/22/2012 1139   GFRNONAA >90 11/12/2011 1430   GFRAA >90 11/12/2011 1430        Review of Systems see hpi Past Medical History  Diagnosis Date  . Hypothyroid   . Anxiety   . Depression   . Arthritis    Past Surgical History  Procedure Laterality Date  . Partial hysterectomy      2011  . Tubal ligation      20 yrs ago  . Esophagogastroduodenoscopy  11/09/2011    Procedure: ESOPHAGOGASTRODUODENOSCOPY (EGD);  Surgeon: Malissa Hippo, MD;  Location: [facility];  Service: Endoscopy;  Laterality: N/A;  730  . Cholecystectomy  11/13/2011    Procedure: LAPAROSCOPIC CHOLECYSTECTOMY;  Surgeon: Dalia Heading, MD;  Location: [facility];  Service: General;  Laterality: N/A;   Current Outpatient Prescriptions on File Prior to Visit  Medication Sig Dispense Refill  . aspirin 81 MG tablet Take 81 mg by mouth daily.      Marland Kitchen buPROPion (WELLBUTRIN XL) 150 MG 24 hr tablet Take 300 mg by mouth daily.       . DULoxetine (CYMBALTA) 20 MG capsule Take 1 capsule (20 mg total) by mouth 2 (two) times daily.  60 capsule  9  . esomeprazole (NEXIUM) 40 MG capsule Take 40 mg by mouth daily before breakfast.      . estradiol (ESTRACE) 0.5 MG tablet Take 0.5 mg by mouth daily.      Marland Kitchen gabapentin (NEURONTIN) 300 MG capsule Take 300 mg by mouth daily as needed. Anxiety Attacks      . lamoTRIgine (LAMICTAL) 100 MG tablet Take 100 mg by mouth 2 (two) times  daily.       . levothyroxine (SYNTHROID, LEVOTHROID) 112 MCG tablet Take 112 mcg by mouth every other day. Alternate with 125 mg      . levothyroxine (SYNTHROID, LEVOTHROID) 125 MCG tablet Take 125 mcg by mouth every other day. Alternate with 112 mcg      . Linaclotide 290 MCG CAPS Take 290 mcg by mouth 1 day or 1 dose.  30 capsule  4  . lubiprostone (AMITIZA) 24 MCG capsule Take 24 mcg by mouth daily as needed. Constipation      . Melaton-Thean-Cham-PassF-LBalm (MELATONIN + L-THEANINE PO) Take 1 tablet by mouth at bedtime.      . ondansetron (ZOFRAN) 4 MG tablet Take 1 tablet (4 mg total) by mouth every 8 (eight) hours as needed for nausea.  30 tablet  1  . verapamil (CALAN-SR) 180 MG CR tablet Take 180 mg by mouth at bedtime as needed. 200mg  for her Headache      . Vortioxetine HBr (BRINTELLIX) 10 MG TABS Take by mouth.      . zolpidem (AMBIEN CR) 12.5 MG CR tablet Take 12.5 mg by mouth at bedtime as needed for sleep.       No current facility-administered medications on file prior to visit.    No Known Allergies      Objective:   Physical Exam  Filed Vitals:   01/24/13 1024  BP: 132/70  Pulse: 60  Temp: 98.6 F (37 C)  Height: 5\' 6"  (1.676 m)  Weight: 165 lb (74.844 kg)   Alert and oriented. Skin warm and dry. Oral mucosa is moist.   . Sclera anicteric, conjunctivae is pink. Thyroid not enlarged. No cervical lymphadenopathy. Lungs clear. Heart regular rate and rhythm.  Abdomen is soft. Bowel sounds are positive. No hepatomegaly. No abdominal masses felt. No tenderness.  No edema to lower extremities.       Assessment:    Nausea which is better now.  Constipation much better with the Linzess.    Plan:    CBC. OV in 6 months.

## 2013-01-26 ENCOUNTER — Telehealth (INDEPENDENT_AMBULATORY_CARE_PROVIDER_SITE_OTHER): Payer: Self-pay | Admitting: Internal Medicine

## 2013-01-26 NOTE — Telephone Encounter (Signed)
No answer at home. Will call tomorrow.  

## 2013-03-02 ENCOUNTER — Other Ambulatory Visit (INDEPENDENT_AMBULATORY_CARE_PROVIDER_SITE_OTHER): Payer: Self-pay | Admitting: Internal Medicine

## 2013-03-03 ENCOUNTER — Emergency Department (HOSPITAL_COMMUNITY): Payer: Federal, State, Local not specified - PPO

## 2013-03-03 ENCOUNTER — Telehealth (INDEPENDENT_AMBULATORY_CARE_PROVIDER_SITE_OTHER): Payer: Self-pay | Admitting: *Deleted

## 2013-03-03 ENCOUNTER — Emergency Department (HOSPITAL_COMMUNITY)
Admission: EM | Admit: 2013-03-03 | Discharge: 2013-03-03 | Disposition: A | Discharge status: Home / Self Care | Payer: Federal, State, Local not specified - PPO | Attending: Emergency Medicine | Admitting: Emergency Medicine

## 2013-03-03 ENCOUNTER — Encounter (HOSPITAL_COMMUNITY): Payer: Self-pay | Admitting: Emergency Medicine

## 2013-03-03 DIAGNOSIS — Z8739 Personal history of other diseases of the musculoskeletal system and connective tissue: Secondary | ICD-10-CM | POA: Insufficient documentation

## 2013-03-03 DIAGNOSIS — E039 Hypothyroidism, unspecified: Secondary | ICD-10-CM | POA: Insufficient documentation

## 2013-03-03 DIAGNOSIS — Z9089 Acquired absence of other organs: Secondary | ICD-10-CM | POA: Insufficient documentation

## 2013-03-03 DIAGNOSIS — F411 Generalized anxiety disorder: Secondary | ICD-10-CM | POA: Insufficient documentation

## 2013-03-03 DIAGNOSIS — R112 Nausea with vomiting, unspecified: Secondary | ICD-10-CM | POA: Insufficient documentation

## 2013-03-03 DIAGNOSIS — F329 Major depressive disorder, single episode, unspecified: Secondary | ICD-10-CM | POA: Insufficient documentation

## 2013-03-03 DIAGNOSIS — Z90710 Acquired absence of both cervix and uterus: Secondary | ICD-10-CM | POA: Insufficient documentation

## 2013-03-03 DIAGNOSIS — F3289 Other specified depressive episodes: Secondary | ICD-10-CM | POA: Insufficient documentation

## 2013-03-03 DIAGNOSIS — Z9889 Other specified postprocedural states: Secondary | ICD-10-CM | POA: Insufficient documentation

## 2013-03-03 DIAGNOSIS — K5900 Constipation, unspecified: Secondary | ICD-10-CM

## 2013-03-03 DIAGNOSIS — Z9851 Tubal ligation status: Secondary | ICD-10-CM | POA: Insufficient documentation

## 2013-03-03 DIAGNOSIS — Z87891 Personal history of nicotine dependence: Secondary | ICD-10-CM | POA: Insufficient documentation

## 2013-03-03 DIAGNOSIS — Z79899 Other long term (current) drug therapy: Secondary | ICD-10-CM | POA: Insufficient documentation

## 2013-03-03 LAB — CBC WITH DIFFERENTIAL/PLATELET
Eosinophils Absolute: 0.1 10*3/uL (ref 0.0–0.7)
HCT: 39.9 % (ref 36.0–46.0)
Hemoglobin: 13.6 g/dL (ref 12.0–15.0)
Lymphs Abs: 2.5 10*3/uL (ref 0.7–4.0)
MCH: 30.2 pg (ref 26.0–34.0)
Monocytes Absolute: 0.4 10*3/uL (ref 0.1–1.0)
Monocytes Relative: 8 % (ref 3–12)
Neutrophils Relative %: 47 % (ref 43–77)
RBC: 4.5 MIL/uL (ref 3.87–5.11)

## 2013-03-03 LAB — URINALYSIS, ROUTINE W REFLEX MICROSCOPIC
Bilirubin Urine: NEGATIVE
Hgb urine dipstick: NEGATIVE
Specific Gravity, Urine: 1.02 (ref 1.005–1.030)
pH: 8.5 — ABNORMAL HIGH (ref 5.0–8.0)

## 2013-03-03 LAB — BASIC METABOLIC PANEL
BUN: 9 mg/dL (ref 6–23)
Chloride: 103 mEq/L (ref 96–112)
Creatinine, Ser: 0.7 mg/dL (ref 0.50–1.10)
GFR calc non Af Amer: >90 mL/min (ref >90)
Glucose, Bld: 95 mg/dL (ref 70–99)
Potassium: 3.9 mEq/L (ref 3.5–5.1)

## 2013-03-03 MED ORDER — DOCUSATE SODIUM 100 MG PO CAPS: 100.0000 mg | ORAL_CAPSULE | Freq: Two times a day (BID) | ORAL | Status: DC

## 2013-03-03 MED ORDER — ONDANSETRON HCL 4 MG/2ML IJ SOLN
4.0000 mg | Freq: Once | INTRAMUSCULAR | Status: AC
  Administered 2013-03-03: 4 mg via INTRAVENOUS
  Filled 2013-03-03: qty 2

## 2013-03-03 MED ORDER — SODIUM CHLORIDE 0.9 % IV SOLN
Freq: Once | INTRAVENOUS | Status: AC
  Administered 2013-03-03: 13:00:00 via INTRAVENOUS

## 2013-03-03 NOTE — ED Provider Notes (Signed)
CSN: [id number]     Arrival date & time 03/03/13  1048 History   First MD Initiated Contact with Patient 03/03/13 1329     Chief Complaint  Patient presents with  . Constipation    Patient is a 52 y.o. female presenting with constipation. The history is provided by the patient.  Constipation Severity:  Mild Time since last bowel movement:  1 week Timing:  Intermittent Progression:  Worsening Chronicity:  New Stool description:  Small and watery Worsened by:  Nothing tried Ineffective treatments:  Enemas Associated symptoms: abdominal pain, nausea and vomiting   Associated symptoms: no dysuria, no fever and no hematochezia   pt reports increasing constipation for past week.  She reports only small BM yesterday despite taking enema She also reports abd pain and vomiting No fever No dysuria No vag bleeding   Past Medical History  Diagnosis Date  . Hypothyroid   . Anxiety   . Depression   . Arthritis    Past Surgical History  Procedure Laterality Date  . Partial hysterectomy      2011  . Tubal ligation      20 yrs ago  . Esophagogastroduodenoscopy  11/09/2011    Procedure: ESOPHAGOGASTRODUODENOSCOPY (EGD);  Surgeon: Malissa Hippo, MD;  Location: [facility];  Service: Endoscopy;  Laterality: N/A;  730  . Cholecystectomy  11/13/2011    Procedure: LAPAROSCOPIC CHOLECYSTECTOMY;  Surgeon: Dalia Heading, MD;  Location: [facility];  Service: General;  Laterality: N/A;   Family History  Problem Relation Age of Onset  . Arthritis Mother   . Stroke Father   . Hypertension Father   . Diabetes Father   . Alcohol abuse Father   . Healthy Son   . Healthy Son   . Bipolar disorder Son   . ADD / ADHD Son   . Healthy Daughter   . Depression Sister   . Anxiety disorder Sister   . Depression Brother   . Depression Paternal Aunt    History  Substance Use Topics  . Smoking status: Former Smoker    Types: Cigarettes    Quit date: 10/26/2007  . Smokeless tobacco: Never Used  .  Alcohol Use: 1.8 oz/week    3 Glasses of wine per week     Comment: on occassion   OB History   Grav Para Term Preterm Abortions TAB SAB Ect Mult Living                 Review of Systems  Constitutional: Negative for fever.  Gastrointestinal: Positive for nausea, vomiting, abdominal pain and constipation. Negative for blood in stool and hematochezia.  Genitourinary: Negative for dysuria.  All other systems reviewed and are negative.    Allergies  Review of patient's allergies indicates no known allergies.  Home Medications   Current Outpatient Rx  Name  Route  Sig  Dispense  Refill  . AXERT 12.5 MG tablet   Oral   Take 1 tablet by mouth daily as needed.         . Bisacodyl (DULCOLAX PO)   Oral   Take 3-4 tablets by mouth daily as needed (constipation).         Marland Kitchen buPROPion (WELLBUTRIN XL) 150 MG 24 hr tablet   Oral   Take 300 mg by mouth daily.          . clonazePAM (KLONOPIN) 0.5 MG tablet   Oral   Take 1 tablet by mouth 2 (two) times daily as needed.          Marland Kitchen  DULoxetine (CYMBALTA) 20 MG capsule   Oral   Take 20 mg by mouth daily.         Marland Kitchen esomeprazole (NEXIUM) 40 MG capsule   Oral   Take 40 mg by mouth daily before breakfast.         . estradiol (ESTRACE) 0.5 MG tablet   Oral   Take 0.5 mg by mouth daily.         . fluconazole (DIFLUCAN) 150 MG tablet   Oral   Take 1 tablet by mouth daily.         Marland Kitchen gabapentin (NEURONTIN) 300 MG capsule   Oral   Take 300 mg by mouth daily as needed. Anxiety Attacks         . lamoTRIgine (LAMICTAL) 100 MG tablet   Oral   Take 100 mg by mouth 2 (two) times daily.          Marland Kitchen levothyroxine (SYNTHROID, LEVOTHROID) 112 MCG tablet   Oral   Take 112 mcg by mouth every other day. Alternate with 125 mg         . levothyroxine (SYNTHROID, LEVOTHROID) 125 MCG tablet   Oral   Take 125 mcg by mouth every other day. Alternate with 112 mcg         . Linaclotide 290 MCG CAPS   Oral   Take 290 mcg by  mouth 1 day or 1 dose.   30 capsule   4   . lubiprostone (AMITIZA) 24 MCG capsule   Oral   Take 24 mcg by mouth daily as needed. Constipation         . magnesium citrate SOLN   Oral   Take 1 Bottle by mouth once.         . Melaton-Thean-Cham-PassF-LBalm (MELATONIN + L-THEANINE PO)   Oral   Take 1 tablet by mouth at bedtime.         . promethazine (PHENERGAN) 25 MG tablet   Oral   Take 1 tablet by mouth 2 (two) times daily as needed for nausea or vomiting.          . Psyllium (METAMUCIL PO)   Oral   Take 15 mLs by mouth daily as needed (constipation).         . Sodium Phosphates (FLEET ENEMA RE)   Rectal   Place 1 enema rectally daily as needed (constipation).         Marland Kitchen TAMIFLU 75 MG capsule   Oral   Take 1 capsule by mouth daily.         . verapamil (CALAN-SR) 180 MG CR tablet   Oral   Take 240 mg by mouth at bedtime. 200mg  for her Headache         . Vortioxetine HBr (BRINTELLIX) 10 MG TABS   Oral   Take 1 tablet by mouth daily.          Marland Kitchen zolpidem (AMBIEN CR) 12.5 MG CR tablet   Oral   Take 12.5 mg by mouth at bedtime as needed for sleep.          BP 108/51  Pulse 65  Temp(Src) 98.4 F (36.9 C) (Oral)  Resp 16  Ht 5' 6.5" (1.689 m)  Wt 163 lb (73.936 kg)  BMI 25.92 kg/m2  SpO2 100% Physical Exam CONSTITUTIONAL: Well developed/well nourished HEAD: Normocephalic/atraumatic EYES: EOMI/PERRL ENMT: Mucous membranes moist NECK: supple no meningeal signs SPINE:entire spine nontender CV: S1/S2 noted, no murmurs/rubs/gallops noted LUNGS: Lungs are clear to  auscultation bilaterally, no apparent distress ABDOMEN: soft, nontender, no rebound or guarding, +BS GU:no cva tenderness Rectal - no stool in rectal vault, no impaction, female chaperone present NEURO: Pt is awake/alert, moves all extremitiesx4 EXTREMITIES: pulses normal, full ROM SKIN: warm, color normal PSYCH: no abnormalities of mood noted  ED Course  Procedures (including  critical care time)  2:20 PM Pt well appearing, low suspicion for bowel obstruction, abd soft, imaging/labs reassuring Advised f/u with her GI physician Will start on colace Labs Review Labs Reviewed  URINALYSIS, ROUTINE W REFLEX MICROSCOPIC - Abnormal; Notable for the following:    APPearance CLOUDY (*)    pH 8.5 (*)    All other components within normal limits  CBC WITH DIFFERENTIAL  BASIC METABOLIC PANEL   Imaging Review Dg Abd 1 View  03/03/2013   CLINICAL DATA:  Constipation  EXAM: ABDOMEN - 1 VIEW  COMPARISON:  CT abdomen pelvis dated 09/29/2011  FINDINGS: Nonobstructive bowel gas pattern.  Normal colonic stool burden.  Visualized osseous structures are within normal limits.  IMPRESSION: Unremarkable abdominal radiograph.   Electronically Signed   By: Charline Bills M.D.   On: 03/03/2013 13:25    EKG Interpretation   None       MDM  No diagnosis found. Nursing notes including past medical history and social history reviewed and considered in documentation Labs/vital reviewed and considered  xrays reviewed and considered     Joya Gaskins, MD 03/03/13 1421

## 2013-03-03 NOTE — Telephone Encounter (Signed)
Mily is constipated and can not have a BM at all. She has tried OTC medication like powders even enemas. Having trouble keeping food down not with vomiting. Please return her call at [phone].

## 2013-03-03 NOTE — ED Notes (Signed)
Pt has been having problems w/ nausea and constipation for months, for the last week symptoms have gotten worse. Has taken mag citrate and enemas w/ no relief.

## 2013-03-03 NOTE — Telephone Encounter (Signed)
I have advised her to try Mag Citrate. If she thinks she is impacted she should go to the emergency or try to disimpact herself.

## 2013-03-03 NOTE — ED Notes (Signed)
Patient is resting comfortably. 

## 2013-03-03 NOTE — ED Notes (Signed)
Patient transported to X-ray 

## 2013-03-16 DIAGNOSIS — R0789 Other chest pain: Secondary | ICD-10-CM

## 2013-03-16 HISTORY — DX: Other chest pain: R07.89

## 2013-03-30 ENCOUNTER — Other Ambulatory Visit (HOSPITAL_COMMUNITY): Payer: Self-pay | Admitting: Family Medicine

## 2013-03-30 ENCOUNTER — Ambulatory Visit (HOSPITAL_COMMUNITY)
Admission: RE | Admit: 2013-03-30 | Discharge: 2013-03-30 | Disposition: A | Discharge status: Home / Self Care | Payer: Federal, State, Local not specified - PPO | Source: Ambulatory Visit | Attending: Family Medicine | Admitting: Family Medicine

## 2013-03-30 DIAGNOSIS — M1990 Unspecified osteoarthritis, unspecified site: Secondary | ICD-10-CM

## 2013-03-30 DIAGNOSIS — M25569 Pain in unspecified knee: Secondary | ICD-10-CM | POA: Diagnosis present

## 2013-03-30 DIAGNOSIS — M259 Joint disorder, unspecified: Secondary | ICD-10-CM | POA: Insufficient documentation

## 2013-06-09 ENCOUNTER — Ambulatory Visit (INDEPENDENT_AMBULATORY_CARE_PROVIDER_SITE_OTHER): Payer: Federal, State, Local not specified - PPO | Admitting: Internal Medicine

## 2013-06-09 ENCOUNTER — Encounter: Payer: Self-pay | Admitting: *Deleted

## 2013-06-09 ENCOUNTER — Encounter: Payer: Self-pay | Admitting: Internal Medicine

## 2013-06-09 VITALS — BP 132/54 | HR 90 | Ht 66.0 in | Wt 165.0 lb

## 2013-06-09 DIAGNOSIS — R0600 Dyspnea, unspecified: Secondary | ICD-10-CM

## 2013-06-09 DIAGNOSIS — R0989 Other specified symptoms and signs involving the circulatory and respiratory systems: Secondary | ICD-10-CM

## 2013-06-09 DIAGNOSIS — R079 Chest pain, unspecified: Secondary | ICD-10-CM

## 2013-06-09 DIAGNOSIS — R0609 Other forms of dyspnea: Secondary | ICD-10-CM

## 2013-06-09 NOTE — Patient Instructions (Signed)
Your physician recommends that you schedule a follow-up appointment in: AS NEEDED PENDING TEST RESULTS  Your physician has requested that you have an echocardiogram. Echocardiography is a painless test that uses sound waves to create images of your heart. It provides your doctor with information about the size and shape of your heart and how well your heart's chambers and valves are working. This procedure takes approximately one hour. There are no restrictions for this procedure.   Your physician has requested that you have a stress echocardiogram. For further information please visit [email or web address]. Please follow instruction sheet as given.    

## 2013-06-09 NOTE — Progress Notes (Signed)
HPI Patient is a 53 yo with history of hypothyroidism, anxiety/depression Referred for evaluation of SOB She was seen by Dr Denyse AmassNyland.earlier this month.  Felt more SOBover last month.  Occur with wlking . Walking dog before end of drive out of breath.  Tired  Slows down.  Not as fast as used to  Getting worse.  Six months ago felt OK Labs she reports were OK Pain in chest while driving here  Gradually getting worse.   No PND  No edema No Known Allergies  Current Outpatient Prescriptions  Medication Sig Dispense Refill  . AXERT 12.5 MG tablet Take 1 tablet by mouth daily as needed.      . Bisacodyl (DULCOLAX PO) Take 3-4 tablets by mouth daily as needed (constipation).      Marland Kitchen. buPROPion (WELLBUTRIN XL) 150 MG 24 hr tablet Take 300 mg by mouth daily.       . clonazePAM (KLONOPIN) 0.5 MG tablet Take 1 tablet by mouth 2 (two) times daily as needed.       . docusate sodium (COLACE) 100 MG capsule Take 1 capsule (100 mg total) by mouth every 12 (twelve) hours.  14 capsule  0  . DULoxetine (CYMBALTA) 20 MG capsule Take 20 mg by mouth daily.      Marland Kitchen. esomeprazole (NEXIUM) 40 MG capsule Take 40 mg by mouth daily before breakfast.      . estradiol (ESTRACE) 0.5 MG tablet Take 0.5 mg by mouth daily.      Marland Kitchen. gabapentin (NEURONTIN) 300 MG capsule Take 300 mg by mouth daily as needed. Anxiety Attacks      . lamoTRIgine (LAMICTAL) 100 MG tablet Take 100 mg by mouth daily.       Marland Kitchen. levothyroxine (SYNTHROID, LEVOTHROID) 112 MCG tablet Take 112 mcg by mouth every other day. Alternate with 125 mg      . levothyroxine (SYNTHROID, LEVOTHROID) 125 MCG tablet Take 125 mcg by mouth every other day. Alternate with 112 mcg      . Melaton-Thean-Cham-PassF-LBalm (MELATONIN + L-THEANINE PO) Take 1 tablet by mouth at bedtime.      . promethazine (PHENERGAN) 25 MG tablet Take 1 tablet by mouth 2 (two) times daily as needed for nausea or vomiting.       . Psyllium (METAMUCIL PO) Take 15 mLs by mouth daily as needed  (constipation).      . Sodium Phosphates (FLEET ENEMA RE) Place 1 enema rectally daily as needed (constipation).      . verapamil (CALAN-SR) 180 MG CR tablet Take 240 mg by mouth at bedtime. 200mg  for her Headache      . Vortioxetine HBr (BRINTELLIX) 10 MG TABS Take 1 tablet by mouth daily.       Marland Kitchen. zolpidem (AMBIEN CR) 12.5 MG CR tablet Take 12.5 mg by mouth at bedtime as needed for sleep.       No current facility-administered medications for this visit.    Past Medical History  Diagnosis Date  . Hypothyroid   . Anxiety   . Depression   . Arthritis     Past Surgical History  Procedure Laterality Date  . Partial hysterectomy      2011  . Tubal ligation      20 yrs ago  . Esophagogastroduodenoscopy  11/09/2011    Procedure: ESOPHAGOGASTRODUODENOSCOPY (EGD);  Surgeon: Malissa HippoNajeeb U Rehman, MD;  Location: [facility];  Service: Endoscopy;  Laterality: N/A;  730  . Cholecystectomy  11/13/2011    Procedure: LAPAROSCOPIC CHOLECYSTECTOMY;  Surgeon: Dalia Heading, MD;  Location: [facility];  Service: General;  Laterality: N/A;    Family History  Problem Relation Age of Onset  . Arthritis Mother   . Stroke Father   . Hypertension Father   . Diabetes Father   . Alcohol abuse Father   . Healthy Son   . Healthy Son   . Bipolar disorder Son   . ADD / ADHD Son   . Healthy Daughter   . Depression Sister   . Anxiety disorder Sister   . Depression Brother   . Depression Paternal Aunt     History   Social History  . Marital Status: Married    Spouse Name: N/A    Number of Children: N/A  . Years of Education: N/A   Occupational History  . Not on file.   Social History Main Topics  . Smoking status: Former Smoker    Types: Cigarettes    Quit date: 10/26/2007  . Smokeless tobacco: Never Used  . Alcohol Use: 1.8 oz/week    3 Glasses of wine per week     Comment: on occassion  . Drug Use: No  . Sexual Activity: Yes    Birth Control/ Protection: None   Other Topics Concern  .  Not on file   Social History Narrative  . No narrative on file    Review of Systems:  All systems reviewed.  They are negative to the above problem except as previously stated.  Vital Signs: BP 132/54  Pulse 90  Ht 5\' 6"  (1.676 m)  Wt 165 lb (74.844 kg)  BMI 26.64 kg/m2  Physical Exam Patient appears a little anxious HEENT:  Normocephalic, atraumatic. EOMI, PERRLA.  Neck: JVP is normal.  No bruits.  Lungs: clear to auscultation. No rales no wheezes.  Heart: Regular rate and rhythm. Normal S1, S2. No S3.   No significant murmurs. PMI not displaced.  Abdomen:  Supple, nontender. Normal bowel sounds. No masses. No hepatomegaly.  Extremities:   Good distal pulses throughout. No lower extremity edema.  Musculoskeletal :moving all extremities.  Neuro:   alert and oriented x3.  CN II-XII grossly intact.  EKG  SR 90  Nonspecific ST T wave changes  Assessment and Plan:  1.  SOB  Will recomm echo and stress echo to evaluate.    2.  CP  I am not convinced cardiac  Patient is anxious  Symptoms atypical  Tests as noted.    Get labs (CBC, TSH and lipids ) from Dr Joyce Copa office.

## 2013-06-30 ENCOUNTER — Ambulatory Visit (HOSPITAL_COMMUNITY): Payer: Federal, State, Local not specified - PPO | Attending: Cardiology | Admitting: Radiology

## 2013-06-30 ENCOUNTER — Other Ambulatory Visit (HOSPITAL_COMMUNITY): Payer: Self-pay

## 2013-06-30 ENCOUNTER — Ambulatory Visit (HOSPITAL_COMMUNITY): Payer: Disability Insurance | Attending: Cardiology

## 2013-06-30 DIAGNOSIS — R0600 Dyspnea, unspecified: Secondary | ICD-10-CM

## 2013-06-30 DIAGNOSIS — R072 Precordial pain: Secondary | ICD-10-CM

## 2013-06-30 DIAGNOSIS — R079 Chest pain, unspecified: Secondary | ICD-10-CM | POA: Insufficient documentation

## 2013-06-30 DIAGNOSIS — R0989 Other specified symptoms and signs involving the circulatory and respiratory systems: Principal | ICD-10-CM | POA: Insufficient documentation

## 2013-06-30 DIAGNOSIS — R0609 Other forms of dyspnea: Secondary | ICD-10-CM | POA: Insufficient documentation

## 2013-06-30 DIAGNOSIS — R0602 Shortness of breath: Secondary | ICD-10-CM

## 2013-06-30 NOTE — Progress Notes (Signed)
Echocardiogram performed.  

## 2013-06-30 NOTE — Progress Notes (Signed)
A stress echocardiogram was performed.

## 2013-07-04 ENCOUNTER — Encounter: Payer: Self-pay | Admitting: Internal Medicine

## 2013-07-04 NOTE — Telephone Encounter (Signed)
This encounter was created in error - please disregard.

## 2013-07-04 NOTE — Telephone Encounter (Signed)
New message ° ° ° ° °Want stress test results °

## 2013-07-04 NOTE — Telephone Encounter (Signed)
Left message for pt to call.

## 2013-07-05 ENCOUNTER — Telehealth (INDEPENDENT_AMBULATORY_CARE_PROVIDER_SITE_OTHER): Payer: Self-pay | Admitting: *Deleted

## 2013-07-05 DIAGNOSIS — Z0289 Encounter for other administrative examinations: Secondary | ICD-10-CM

## 2013-07-05 NOTE — Telephone Encounter (Signed)
Ariel Ramirez is not able to go to the bathroom for 4 to 5 days now. She has tried metamucil, stool softeners, enemas, etc. Nothing has worked. Would like to know if there is anything stronger than the OTC medications that could be called in to help her. The return phone number is [phone].

## 2013-07-05 NOTE — Telephone Encounter (Signed)
She has tried Linzess, Museum/gallery curatorAmitaz, she has used 3-4 enema, and Metamucil. She has also tried Miralax. She has also has tried [employer] Catrate which has not helped,. Her last BM was this am but was small about 4 inches. Her last normal BM was about 2-3 weeks ago. She says she has had some loose stools. If she thinks she is impacted, I advised her to go to the ED

## 2013-07-05 NOTE — Telephone Encounter (Signed)
Message left at home for her to return call.  

## 2013-08-01 ENCOUNTER — Ambulatory Visit (INDEPENDENT_AMBULATORY_CARE_PROVIDER_SITE_OTHER): Payer: Self-pay | Admitting: Internal Medicine

## 2014-02-14 ENCOUNTER — Other Ambulatory Visit: Payer: Self-pay | Admitting: Obstetrics & Gynecology

## 2014-02-14 ENCOUNTER — Ambulatory Visit: Payer: Self-pay | Admitting: Physical Therapy

## 2014-02-16 LAB — CYTOLOGY - PAP

## 2014-02-20 ENCOUNTER — Ambulatory Visit: Payer: Federal, State, Local not specified - PPO | Attending: Neurology | Admitting: Physical Therapy

## 2014-02-20 DIAGNOSIS — M542 Cervicalgia: Secondary | ICD-10-CM | POA: Diagnosis present

## 2014-02-22 ENCOUNTER — Ambulatory Visit: Payer: Federal, State, Local not specified - PPO | Admitting: Physical Therapy

## 2014-02-22 DIAGNOSIS — M542 Cervicalgia: Secondary | ICD-10-CM | POA: Diagnosis not present

## 2014-02-27 ENCOUNTER — Ambulatory Visit: Payer: Federal, State, Local not specified - PPO | Admitting: *Deleted

## 2014-02-27 DIAGNOSIS — M542 Cervicalgia: Secondary | ICD-10-CM | POA: Diagnosis not present

## 2014-03-01 ENCOUNTER — Encounter: Payer: Self-pay | Admitting: Physical Therapy

## 2014-08-29 ENCOUNTER — Encounter (HOSPITAL_COMMUNITY): Payer: Self-pay

## 2014-08-29 ENCOUNTER — Emergency Department (HOSPITAL_COMMUNITY)
Admission: EM | Admit: 2014-08-29 | Discharge: 2014-08-29 | Disposition: A | Discharge status: Home / Self Care | Payer: Federal, State, Local not specified - PPO | Attending: Emergency Medicine | Admitting: Emergency Medicine

## 2014-08-29 ENCOUNTER — Emergency Department (HOSPITAL_COMMUNITY): Payer: Federal, State, Local not specified - PPO

## 2014-08-29 DIAGNOSIS — W19XXXA Unspecified fall, initial encounter: Secondary | ICD-10-CM

## 2014-08-29 DIAGNOSIS — Y998 Other external cause status: Secondary | ICD-10-CM | POA: Insufficient documentation

## 2014-08-29 DIAGNOSIS — Z8739 Personal history of other diseases of the musculoskeletal system and connective tissue: Secondary | ICD-10-CM | POA: Diagnosis not present

## 2014-08-29 DIAGNOSIS — Z79899 Other long term (current) drug therapy: Secondary | ICD-10-CM | POA: Insufficient documentation

## 2014-08-29 DIAGNOSIS — W01198A Fall on same level from slipping, tripping and stumbling with subsequent striking against other object, initial encounter: Secondary | ICD-10-CM | POA: Diagnosis not present

## 2014-08-29 DIAGNOSIS — E039 Hypothyroidism, unspecified: Secondary | ICD-10-CM | POA: Insufficient documentation

## 2014-08-29 DIAGNOSIS — Y9289 Other specified places as the place of occurrence of the external cause: Secondary | ICD-10-CM | POA: Insufficient documentation

## 2014-08-29 DIAGNOSIS — F329 Major depressive disorder, single episode, unspecified: Secondary | ICD-10-CM | POA: Diagnosis not present

## 2014-08-29 DIAGNOSIS — S161XXA Strain of muscle, fascia and tendon at neck level, initial encounter: Secondary | ICD-10-CM | POA: Insufficient documentation

## 2014-08-29 DIAGNOSIS — S199XXA Unspecified injury of neck, initial encounter: Secondary | ICD-10-CM | POA: Diagnosis present

## 2014-08-29 DIAGNOSIS — F419 Anxiety disorder, unspecified: Secondary | ICD-10-CM | POA: Insufficient documentation

## 2014-08-29 DIAGNOSIS — S0990XA Unspecified injury of head, initial encounter: Secondary | ICD-10-CM | POA: Insufficient documentation

## 2014-08-29 DIAGNOSIS — Z87891 Personal history of nicotine dependence: Secondary | ICD-10-CM | POA: Diagnosis not present

## 2014-08-29 DIAGNOSIS — Y9389 Activity, other specified: Secondary | ICD-10-CM | POA: Insufficient documentation

## 2014-08-29 MED ORDER — OXYCODONE-ACETAMINOPHEN 5-325 MG PO TABS
ORAL_TABLET | ORAL | Status: AC
  Filled 2014-08-29: qty 1

## 2014-08-29 MED ORDER — OXYCODONE-ACETAMINOPHEN 5-325 MG PO TABS
2.0000 | ORAL_TABLET | Freq: Once | ORAL | Status: AC
  Administered 2014-08-29: 2 via ORAL
  Filled 2014-08-29: qty 2

## 2014-08-29 MED ORDER — LORAZEPAM 0.5 MG PO TABS
0.5000 mg | ORAL_TABLET | Freq: Once | ORAL | Status: AC
  Administered 2014-08-29: 0.5 mg via ORAL
  Filled 2014-08-29: qty 1

## 2014-08-29 NOTE — ED Notes (Addendum)
Pt reports was painting and fell off of the top of her step stool.  Pt c/o pain in r buttock  And r ear ringing.  Pt says struck her head on a door when she fell and thinks may have "blacked out for a couple of seconds."  Reports ear ringing started after hitting head.   Pt also says approx 1 hour ago, started having tingling in fingers and toes.

## 2014-08-29 NOTE — Discharge Instructions (Signed)

## 2014-08-29 NOTE — ED Notes (Signed)
MD at bedside. 

## 2014-08-29 NOTE — ED Notes (Signed)
c-collar applied at triage 

## 2014-09-06 NOTE — ED Provider Notes (Signed)
CSN: [id number]     Arrival date & time 08/29/14  1713 History   First MD Initiated Contact with Patient 08/29/14 1733     Chief Complaint  Patient presents with  . Fall     (Consider location/radiation/quality/duration/timing/severity/associated sxs/prior Treatment) HPI   54 year old female presenting after fall. Patient was painting when she lost her balance from a height of approximately 1.5 feet from the ground. She fell onto her butt but struck her head on a door on the way down. Possible brief loss of consciousness. Ringing in her right ear. Mild right-sided neck pain. No acute visual changes. No acute numbness, tingling or loss of strength. No blood thinners. Has been walking since the accident without unsteadiness.  Past Medical History  Diagnosis Date  . Hypothyroid   . Anxiety   . Depression   . Arthritis    Past Surgical History  Procedure Laterality Date  . Partial hysterectomy      2011  . Tubal ligation      20 yrs ago  . Esophagogastroduodenoscopy  11/09/2011    Procedure: ESOPHAGOGASTRODUODENOSCOPY (EGD);  Surgeon: Malissa Hippo, MD;  Location: [facility];  Service: Endoscopy;  Laterality: N/A;  730  . Cholecystectomy  11/13/2011    Procedure: LAPAROSCOPIC CHOLECYSTECTOMY;  Surgeon: Dalia Heading, MD;  Location: [facility];  Service: General;  Laterality: N/A;   Family History  Problem Relation Age of Onset  . Arthritis Mother   . Stroke Father   . Hypertension Father   . Diabetes Father   . Alcohol abuse Father   . Healthy Son   . Healthy Son   . Bipolar disorder Son   . ADD / ADHD Son   . Healthy Daughter   . Depression Sister   . Anxiety disorder Sister   . Depression Brother   . Depression Paternal Aunt    History  Substance Use Topics  . Smoking status: Former Smoker    Types: Cigarettes    Quit date: 10/26/2007  . Smokeless tobacco: Never Used  . Alcohol Use: 1.8 oz/week    3 Glasses of wine per week     Comment: on occassion   OB  History    No data available     Review of Systems  All systems reviewed and negative, other than as noted in HPI.   Allergies  Review of patient's allergies indicates no known allergies.  Home Medications   Prior to Admission medications   Medication Sig Start Date End Date Taking? Authorizing Provider  AXERT 12.5 MG tablet Take 1 tablet by mouth daily as needed. 03/02/13   Historical Provider, MD  Bisacodyl (DULCOLAX PO) Take 3-4 tablets by mouth daily as needed (constipation).    Historical Provider, MD  buPROPion (WELLBUTRIN XL) 150 MG 24 hr tablet Take 300 mg by mouth daily.     Historical Provider, MD  clonazePAM (KLONOPIN) 0.5 MG tablet Take 1 tablet by mouth 2 (two) times daily as needed.  03/02/13   Historical Provider, MD  docusate sodium (COLACE) 100 MG capsule Take 1 capsule (100 mg total) by mouth every 12 (twelve) hours. 03/03/13   Zadie Rhine, MD  DULoxetine (CYMBALTA) 20 MG capsule Take 20 mg by mouth daily.    Historical Provider, MD  esomeprazole (NEXIUM) 40 MG capsule Take 40 mg by mouth daily before breakfast.    Historical Provider, MD  estradiol (ESTRACE) 0.5 MG tablet Take 0.5 mg by mouth daily.    Historical Provider, MD  gabapentin (NEURONTIN) 300 MG capsule Take 300 mg by mouth daily as needed. Anxiety Attacks    Historical Provider, MD  lamoTRIgine (LAMICTAL) 100 MG tablet Take 100 mg by mouth daily.     Historical Provider, MD  levothyroxine (SYNTHROID, LEVOTHROID) 112 MCG tablet Take 112 mcg by mouth every other day. Alternate with 125 mg    Historical Provider, MD  levothyroxine (SYNTHROID, LEVOTHROID) 125 MCG tablet Take 125 mcg by mouth every other day. Alternate with 112 mcg    Historical Provider, MD  Melaton-Thean-Cham-PassF-LBalm (MELATONIN + L-THEANINE PO) Take 1 tablet by mouth at bedtime.    Historical Provider, MD  promethazine (PHENERGAN) 25 MG tablet Take 1 tablet by mouth 2 (two) times daily as needed for nausea or vomiting.  02/27/13    Historical Provider, MD  Psyllium (METAMUCIL PO) Take 15 mLs by mouth daily as needed (constipation).    Historical Provider, MD  Sodium Phosphates (FLEET ENEMA RE) Place 1 enema rectally daily as needed (constipation).    Historical Provider, MD  verapamil (CALAN-SR) 180 MG CR tablet Take 240 mg by mouth at bedtime.  for her Headache    Historical Provider, MD  Vortioxetine HBr (BRINTELLIX) 10 MG TABS Take 1 tablet by mouth daily.     Historical Provider, MD  zolpidem (AMBIEN CR) 12.5 MG CR tablet Take 12.5 mg by mouth at bedtime as needed for sleep.    Historical Provider, MD   BP 114/63 mmHg  Pulse 75  Temp(Src) 98.3 F (36.8 C) (Oral)  Resp 14  Ht  (1.676 m)  Wt 160 lb (72.576 kg)  BMI 25.84 kg/m2  SpO2 98% Physical Exam  Constitutional: She is oriented to person, place, and time. She appears well-developed and well-nourished. No distress.  HENT:  Head: Normocephalic and atraumatic.  Eyes: Conjunctivae are normal. Right eye exhibits no discharge. Left eye exhibits no discharge.  Neck: Neck supple.  Cardiovascular: Normal rate, regular rhythm and normal heart sounds.  Exam reveals no gallop and no friction rub.   No murmur heard. Pulmonary/Chest: Effort normal and breath sounds normal. No respiratory distress.  Abdominal: Soft. She exhibits no distension. There is no tenderness.  Musculoskeletal: She exhibits no edema or tenderness.  No midline spinal tenderness  Neurological: She is alert and oriented to person, place, and time. No cranial nerve deficit. She exhibits normal muscle tone. Coordination normal.  Good finger to nose testing bilaterally.  Skin: Skin is warm and dry.  Psychiatric: She has a normal mood and affect. Her behavior is normal. Thought content normal.  Nursing note and vitals reviewed.   ED Course  Procedures (including critical care time) Labs Review Labs Reviewed - No data to display  Imaging Review No results found.   Ct Head Wo  Contrast  08/29/2014   CLINICAL DATA:  54 year old female status post fall from a step stool now with right sided tinnitus  EXAM: CT HEAD WITHOUT CONTRAST  CT CERVICAL SPINE WITHOUT CONTRAST  TECHNIQUE: Multidetector CT imaging of the head and cervical spine was performed following the standard protocol without intravenous contrast. Multiplanar CT image reconstructions of the cervical spine were also generated.  COMPARISON:  None.  FINDINGS: CT HEAD FINDINGS  Negative for acute intracranial hemorrhage, acute infarction, mass, mass effect, hydrocephalus or midline shift. Gray-white differentiation is preserved throughout. No focal scalp hematoma or other soft tissue abnormality. The calvarium is intact. Globes and orbits are symmetric bilaterally. Normal aeration of the mastoid air cells and paranasal sinuses.  CT CERVICAL SPINE FINDINGS  No acute fracture, malalignment or prevertebral soft tissue swelling. Focal degenerative disc disease at C5-C6 were there is loss of disc space height and a posterior disc osteophyte complex. Unremarkable CT appearance of the thyroid gland. No acute soft tissue abnormality. The lung apices are unremarkable.  IMPRESSION: CT HEAD  1. Negative CT CSPINE  1. No acute fracture or malalignment. 2. Focal degenerative disc disease at C5-C6.   Electronically Signed   By: Malachy Moan M.D.   On: 08/29/2014 18:40   Ct Cervical Spine Wo Contrast  08/29/2014   CLINICAL DATA:  54 year old female status post fall from a step stool now with right sided tinnitus  EXAM: CT HEAD WITHOUT CONTRAST  CT CERVICAL SPINE WITHOUT CONTRAST  TECHNIQUE: Multidetector CT imaging of the head and cervical spine was performed following the standard protocol without intravenous contrast. Multiplanar CT image reconstructions of the cervical spine were also generated.  COMPARISON:  None.  FINDINGS: CT HEAD FINDINGS  Negative for acute intracranial hemorrhage, acute infarction, mass, mass effect, hydrocephalus  or midline shift. Gray-white differentiation is preserved throughout. No focal scalp hematoma or other soft tissue abnormality. The calvarium is intact. Globes and orbits are symmetric bilaterally. Normal aeration of the mastoid air cells and paranasal sinuses.  CT CERVICAL SPINE FINDINGS  No acute fracture, malalignment or prevertebral soft tissue swelling. Focal degenerative disc disease at C5-C6 were there is loss of disc space height and a posterior disc osteophyte complex. Unremarkable CT appearance of the thyroid gland. No acute soft tissue abnormality. The lung apices are unremarkable.  IMPRESSION: CT HEAD  1. Negative CT CSPINE  1. No acute fracture or malalignment. 2. Focal degenerative disc disease at C5-C6.   Electronically Signed   By: Malachy Moan M.D.   On: 08/29/2014 18:40     EKG Interpretation None      MDM   Final diagnoses:  Fall, initial encounter  Cervical strain, acute, initial encounter    54 year old female with pain after fall from stool. Nonfocal neuro exam. Imaging without acute abnormality. Return precautions for discussed. Symptomatic treatment otherwise.  Raeford Razor, MD 09/06/14 1335

## 2015-04-02 ENCOUNTER — Other Ambulatory Visit (HOSPITAL_COMMUNITY)
Admission: RE | Admit: 2015-04-02 | Discharge: 2015-04-02 | Disposition: A | Discharge status: Home / Self Care | Payer: Federal, State, Local not specified - PPO | Source: Ambulatory Visit | Attending: Psychiatry | Admitting: Psychiatry

## 2015-04-02 DIAGNOSIS — F4310 Post-traumatic stress disorder, unspecified: Secondary | ICD-10-CM | POA: Insufficient documentation

## 2015-04-02 DIAGNOSIS — Z79899 Other long term (current) drug therapy: Secondary | ICD-10-CM | POA: Diagnosis present

## 2015-04-02 DIAGNOSIS — F332 Major depressive disorder, recurrent severe without psychotic features: Secondary | ICD-10-CM | POA: Insufficient documentation

## 2015-04-02 LAB — COMPREHENSIVE METABOLIC PANEL WITH GFR
ALT: 18 U/L (ref 14–54)
AST: 23 U/L (ref 15–41)
Albumin: 4.1 g/dL (ref 3.5–5.0)
Alkaline Phosphatase: 51 U/L (ref 38–126)
Anion gap: 8 (ref 5–15)
BUN: 22 mg/dL — ABNORMAL HIGH (ref 6–20)
CO2: 29 mmol/L (ref 22–32)
Calcium: 9.5 mg/dL (ref 8.9–10.3)
Chloride: 102 mmol/L (ref 101–111)
Creatinine, Ser: 0.84 mg/dL (ref 0.44–1.00)
GFR calc Af Amer: >60 mL/min
GFR calc non Af Amer: >60 mL/min
Glucose, Bld: 97 mg/dL (ref 65–99)
Potassium: 4.3 mmol/L (ref 3.5–5.1)
Sodium: 139 mmol/L (ref 135–145)
Total Bilirubin: 0.6 mg/dL (ref 0.3–1.2)
Total Protein: 6.7 g/dL (ref 6.5–8.1)

## 2015-04-02 LAB — CBC
HCT: 37.5 % (ref 36.0–46.0)
Hemoglobin: 12.8 g/dL (ref 12.0–15.0)
MCH: 30.3 pg (ref 26.0–34.0)
MCHC: 34.1 g/dL (ref 30.0–36.0)
MCV: 88.7 fL (ref 78.0–100.0)
Platelets: 203 10*3/uL (ref 150–400)
RBC: 4.23 MIL/uL (ref 3.87–5.11)
RDW: 13 % (ref 11.5–15.5)
WBC: 4 10*3/uL (ref 4.0–10.5)

## 2015-04-02 LAB — FOLATE: Folate: 14.8 ng/mL

## 2015-04-02 LAB — TSH: TSH: 3.794 u[IU]/mL (ref 0.350–4.500)

## 2015-04-03 LAB — VITAMIN D 25 HYDROXY (VIT D DEFICIENCY, FRACTURES): VIT D 25 HYDROXY: 19.1 ng/mL — AB (ref 30.0–100.0)

## 2015-09-19 ENCOUNTER — Ambulatory Visit: Payer: Federal, State, Local not specified - PPO | Attending: Sports Medicine | Admitting: Physical Therapy

## 2015-09-19 DIAGNOSIS — M25612 Stiffness of left shoulder, not elsewhere classified: Secondary | ICD-10-CM

## 2015-09-19 DIAGNOSIS — M25512 Pain in left shoulder: Secondary | ICD-10-CM | POA: Diagnosis not present

## 2015-09-19 NOTE — Patient Instructions (Signed)
The patient attempted a gentle corner stretch but it was too painful.  We need to begin with gentle manual stretching.

## 2015-09-19 NOTE — Therapy (Signed)
[facility] [address] [place], [place], [address] Phone: [phone]   Fax:  [phone]  Physical Therapy Evaluation  Patient Details  Name: Ariel Ramirez MRN: [id number] Date of Birth: 09-May-1960 Referring Provider: Pati Gallo MD.  Encounter Date: 09/19/2015      PT End of Session - 09/19/15 1817    PT Start Time 0232   PT Stop Time 0323   PT Time Calculation (min) 51 min   Activity Tolerance Patient tolerated treatment well   Behavior During Therapy [facility] for tasks assessed/performed      Past Medical History  Diagnosis Date  . Hypothyroid   . Anxiety   . Depression   . Arthritis     Past Surgical History  Procedure Laterality Date  . Partial hysterectomy      2011  . Tubal ligation      20 yrs ago  . Esophagogastroduodenoscopy  11/09/2011    Procedure: ESOPHAGOGASTRODUODENOSCOPY (EGD);  Surgeon: Malissa Hippo, MD;  Location: [facility];  Service: Endoscopy;  Laterality: N/A;  730  . Cholecystectomy  11/13/2011    Procedure: LAPAROSCOPIC CHOLECYSTECTOMY;  Surgeon: Dalia Heading, MD;  Location: [facility];  Service: General;  Laterality: N/A;    There were no vitals filed for this visit.       Subjective Assessment - 09/19/15 1828    Subjective I should have come to therapy before but i was afraid it would hurt too much.   Limitations --  Reaching overhead.   Patient Stated Goals Use my right arm without pain.   Currently in Pain? Yes   Pain Score 4    Pain Location Shoulder   Pain Orientation Left   Pain Descriptors / Indicators Aching;Throbbing   Pain Type Acute pain   Pain Onset More than a month ago   Pain Frequency Constant   Aggravating Factors  Certain left shoulder movements.   Pain Relieving Factors Resting my arm.   Multiple Pain Sites No            [facility] PT Assessment - 09/19/15 0001    Assessment   Medical Diagnosis Left rotator cuff tendonitis.   Referring Provider Pati Gallo  MD.   Onset Date/Surgical Date --  3 months.   Precautions   Precautions None   Restrictions   Weight Bearing Restrictions No   Balance Screen   Has the patient fallen in the past 6 months No   Has the patient had a decrease in activity level because of a fear of falling?  No   Is the patient reluctant to leave their home because of a fear of falling?  No   Home Environment   Living Environment Private residence   Prior Function   Level of Independence Independent   Posture/Postural Control   Posture/Postural Control Postural limitations   Postural Limitations Rounded Shoulders;Forward head  Scapular retraction.   ROM / Strength   AROM / PROM / Strength AROM;Strength   AROM   Overall AROM Comments Active left shoulder anti-gravity flexion= 140 dgerees (left= 165 degrees); left ER= 62 degrees; behind back to only left hip region.   Strength   Overall Strength Comments Left shoulder IR/ER= 4 to 4+/5.   Palpation   Palpation comment Tender at left acromial ridge with referred pain into left middle deltoid region.   Special Tests    Special Tests Rotator Cuff Impingement   Rotator Cuff Impingment tests Neer impingement test;Hawkins- Kyung Rudd test  Neer Impingement test    Findings Positive   Side Left   Hawkins-Kennedy test   Findings Positive   Side Left   Ambulation/Gait   Gait Comments WNL.                   [facility]/Exercise - 09/19/15 0001    Modalities   Modalities Electrical Stimulation   Electrical Stimulation   Electrical Stimulation Location Left shoulder.   Electrical Stimulation Action IFC   Electrical Stimulation Parameters 80-150 Hz at 100% scan x 20 minutes.   Electrical Stimulation Goals Pain                     PT Long Term Goals - 09/19/15 1846    PT LONG TERM GOAL #1   Title Independent with a HEP.   Time 4   Period Weeks   Status New   PT LONG TERM GOAL #2   Title Active left shoulder flexion to 155  degrees so the patient can easily reach overhead   Time 4   Period Weeks   Status New   PT LONG TERM GOAL #3   Title Active left shoulder ER to 80 degrees+ to allow for easily donning/doffing of apparel   PT LONG TERM GOAL #4   Title Perform ADL's with pain not > 3/10.   Time 4   Period Weeks   Status New   PT LONG TERM GOAL #5   Title Increase ROM so patient is able to reach behind back to L3.   Time 4   Period Weeks   Status New               Plan - 09/19/15 1832    Clinical Impression Statement The patient states her left shoulder began to hurt about three months ago.  Her pain-level today in clinic with left arm by side is a 4/10, however, with certain her pain reaches a 10/10.  She cannot sleep on her left side due to pain.  She demonstrates loss of left shoulder range of motion and she has a positive Impingement sign.   Rehab Potential Excellent   PT Frequency 2x / week   PT Duration 6 weeks   PT Treatment/Interventions ADLs/Self Care Home Management;Cryotherapy;Electrical Stimulation;Therapeutic exercise;Therapeutic activities;Ultrasound;Patient/family education;Manual techniques;Passive range of motion;Dry needling   PT Next Visit Plan Combo e'stim/U/S; gentle stretching to include capsular stretching; RW4 and scapular strengthening.   Consulted and Agree with Plan of Care Patient      Patient will benefit from skilled therapeutic intervention in order to improve the following deficits and impairments:  Pain, Decreased range of motion, Decreased activity tolerance  Visit Diagnosis: Pain in left shoulder - Plan: PT plan of care cert/re-cert  Stiffness of left shoulder, not elsewhere classified - Plan: PT plan of care cert/re-cert     Problem List Patient Active Problem List   Diagnosis Date Noted  . Unspecified constipation 12/22/2012  . Bipolar disorder, unspecified (HCC) 05/25/2012  . Post traumatic stress disorder (PTSD) 05/25/2012  . Arthritis  05/25/2012  . Depression 05/25/2012  . GERD (gastroesophageal reflux disease) 10/26/2011  . Abdominal pain, chronic, right upper quadrant 09/25/2011  . HYPOTHYROIDISM 01/22/2010  . UNSPECIFIED ANEMIA 01/22/2010  . ALOPECIA 01/22/2010  . CRAMP OF LIMB 01/22/2010    Rollins Wrightson, [place] MPT 09/19/2015, 6:50 PM  [facility] [address] [place], [place], [address] Phone: [phone]   Fax:  [phone]  Name: Marylene Landngela  Jacqulynn CadetC Stucky MRN: [id number] Date of Birth: 1960-09-10

## 2015-09-24 ENCOUNTER — Ambulatory Visit: Payer: Federal, State, Local not specified - PPO | Admitting: Physical Therapy

## 2015-09-24 ENCOUNTER — Encounter: Payer: Self-pay | Admitting: Physical Therapy

## 2015-09-24 DIAGNOSIS — M25512 Pain in left shoulder: Secondary | ICD-10-CM

## 2015-09-24 DIAGNOSIS — M25612 Stiffness of left shoulder, not elsewhere classified: Secondary | ICD-10-CM

## 2015-09-24 NOTE — Patient Instructions (Signed)
Resisted Horizontal Abduction: Bilateral    Sit or stand, tubing in both hands, arms out in front. Keeping arms straight, pinch shoulder blades together and stretch arms out. Repeat _10___ times per set. Do __2__ sets per session. Do __2-3__ sessions per day.  http://orth.exer.us/968   Copyright  VHI. All rights reserved.  Scapular Retraction: Bilateral    Facing anchor, pull arms back, bringing shoulder blades together. Repeat __10__ times per set. Do _2___ sets per session. Do _2-3___ sessions per day.  http://orth.exer.us/176   Copyright  VHI. All rights reserved.  Strengthening: Resisted External Rotation    Hold tubing in left hand, elbow at side and forearm across body. Rotate forearm out. Repeat __10__ times per set. Do _2___ sets per session. Do __2-3__ sessions per day.     Lay on your back with the end of the yellow band in each hand. Keep both elbows close to your side and bring your hands apart. Repeat 2 sets of 10 times for 2-3 times per day.  http://orth.exer.us/828   Copyright  VHI. All rights reserved.

## 2015-09-24 NOTE — Therapy (Signed)
[facility] [address] [place], [place], [address] Phone: [phone]   Fax:  [phone]  Physical Therapy Treatment  Patient Details  Name: Marcine Matarngela C Cua MRN: [id number] Date of Birth: 11/07/1960 Referring Provider: Pati GalloJames Kramer MD.  Encounter Date: 09/24/2015      PT End of Session - 09/24/15 1648    Visit Number 2   PT Start Time 1645   PT Stop Time 1742   PT Time Calculation (min) 57 min   Activity Tolerance Patient tolerated treatment well   Behavior During Therapy [facility] for tasks assessed/performed      Past Medical History  Diagnosis Date  . Hypothyroid   . Anxiety   . Depression   . Arthritis     Past Surgical History  Procedure Laterality Date  . Partial hysterectomy      2011  . Tubal ligation      20 yrs ago  . Esophagogastroduodenoscopy  11/09/2011    Procedure: ESOPHAGOGASTRODUODENOSCOPY (EGD);  Surgeon: Malissa HippoNajeeb U Rehman, MD;  [facility];  Service: Endoscopy;  Laterality: N/A;  730  . Cholecystectomy  11/13/2011    Procedure: LAPAROSCOPIC CHOLECYSTECTOMY;  Surgeon: Dalia HeadingMark A Jenkins, MD;  Location: [facility];  Service: General;  Laterality: N/A;    There were no vitals filed for this visit.      Subjective Assessment - 09/24/15 1648    Subjective Reports that she has been doing some exercises and has trouble still with ER.   Patient Stated Goals Use my right arm without pain.   Currently in Pain? No/denies  at rest; only pain with exercises per patient report            [facility] PT Assessment - 09/24/15 0001    Assessment   Medical Diagnosis Left rotator cuff tendonitis.   Next MD Visit Depending on PT results   Precautions   Precautions None   Restrictions   Weight Bearing Restrictions No                     [facility] Adult PT Treatment/Exercise - 09/24/15 0001    Exercises   Exercises Shoulder   Shoulder Exercises: Supine   Horizontal ABduction Strengthening;Both;20  reps;Theraband   Theraband Level (Shoulder Horizontal ABduction) Level 1 (Yellow)   External Rotation Strengthening;Both;20 reps;Theraband   Theraband Level (Shoulder External Rotation) Level 1 (Yellow)   Shoulder Exercises: Standing   External Rotation Strengthening;Left;20 reps;Theraband   Theraband Level (Shoulder External Rotation) Level 1 (Yellow)   Flexion Strengthening;Both;20 reps;Weights   Shoulder Flexion Weight (lbs) 1   Extension Strengthening;Left;20 reps;Theraband   Theraband Level (Shoulder Extension) Level 1 (Yellow)   Row Strengthening;Left;20 reps;Theraband   Theraband Level (Shoulder Row) Level 1 (Yellow)   Other Standing Exercises LUE ranger flex/circles x20 reps   Other Standing Exercises B shoulder scaption 1# x20 reps   Shoulder Exercises: ROM/Strengthening   UBE (Upper Arm Bike) 120 RPM x 6 min   Shoulder Exercises: Stretch   Internal Rotation Stretch 10 seconds   Modalities   Modalities Electrical Stimulation;Moist Heat   Moist Heat Therapy   Number Minutes Moist Heat 15 Minutes   Moist Heat Location Shoulder   Electrical Stimulation   Electrical Stimulation Location Left shoulder.   Electrical Stimulation Action Pre-Mod   Electrical Stimulation Parameters 80-150 hz x15 min   Electrical Stimulation Goals Pain   Manual Therapy   Manual Therapy Joint mobilization;Passive ROM   Joint Mobilization L glenohumeral joint mobilizations into  A/P/I directions grade I-II to decrease pain   Passive ROM Gentle PROM of L shoulder into flex/scap/ER/IR with genlte holds at end range                PT Education - 09/24/15 1749    Education provided Yes   Education Details HEP- resisted row, horizontal abduction, ER, bilateral ER in supine   Person(s) Educated Patient   Methods Explanation;Demonstration;Verbal cues;Handout   Comprehension Verbalized understanding;Returned demonstration;Verbal cues required             PT Long Term Goals - 09/19/15 1846     PT LONG TERM GOAL #1   Title Independent with a HEP.   Time 4   Period Weeks   Status New   PT LONG TERM GOAL #2   Title Active left shoulder flexion to 155 degrees so the patient can easily reach overhead   Time 4   Period Weeks   Status New   PT LONG TERM GOAL #3   Title Active left shoulder ER to 80 degrees+ to allow for easily donning/doffing of apparel   PT LONG TERM GOAL #4   Title Perform ADL's with pain not > 3/10.   Time 4   Period Weeks   Status New   PT LONG TERM GOAL #5   Title Increase ROM so patient is able to reach behind back to L3.   Time 4   Period Weeks   Status New               Plan - 09/24/15 1751    Clinical Impression Statement Patient tolerated today's treatment fairly well as patinet was educated to keep exercises painfree. Completed all exercises and required minimal to moderate multimodal cueing for exercise technique. Patient experienced "feeling it" in L shoulder with exercises although when patient attempted AROM D2 pattern of L shoulder she was unable to complete secondary to pain. Patient tolerated glenohumeral joint mobilizations and gentle PROM of L shoulder with firm end feels noted in all directions well with no reports of pain. Normal modalities response noted following removal of the modalities. Patient accepted new HEP without questions regarding parameters and to keep exercises painfree.   Rehab Potential Excellent   PT Frequency 2x / week   PT Duration 6 weeks   PT Treatment/Interventions ADLs/Self Care Home Management;Cryotherapy;Electrical Stimulation;Therapeutic exercise;Therapeutic activities;Ultrasound;Patient/family education;Manual techniques;Passive range of motion;Dry needling   PT Next Visit Plan Combo e'stim/U/S; gentle stretching to include capsular stretching; RW4 and scapular strengthening.   PT Home Exercise Plan HEP- Resisted ER, row, horizontal abduction, supine B ER with yellow theraband   Consulted and Agree with  Plan of Care Patient      Patient will benefit from skilled therapeutic intervention in order to improve the following deficits and impairments:  Pain, Decreased range of motion, Decreased activity tolerance  Visit Diagnosis: Pain in left shoulder  Stiffness of left shoulder, not elsewhere classified     Problem List Patient Active Problem List   Diagnosis Date Noted  . Unspecified constipation 12/22/2012  . Bipolar disorder, unspecified (HCC) 05/25/2012  . Post traumatic stress disorder (PTSD) 05/25/2012  . Arthritis 05/25/2012  . Depression 05/25/2012  . GERD (gastroesophageal reflux disease) 10/26/2011  . Abdominal pain, chronic, right upper quadrant 09/25/2011  . HYPOTHYROIDISM 01/22/2010  . UNSPECIFIED ANEMIA 01/22/2010  . ALOPECIA 01/22/2010  . CRAMP OF LIMB 01/22/2010    Evelene Croon, PTA 09/24/2015, 6:00 PM  [facility] [address] [place], [place], [address] Phone: [phone]   Fax:  [phone]  Name: KEANNA TUGWELL MRN: [id number] Date of Birth: [birth year]

## 2015-09-26 ENCOUNTER — Encounter: Payer: Self-pay | Admitting: Physical Therapy

## 2015-09-26 ENCOUNTER — Ambulatory Visit: Payer: Federal, State, Local not specified - PPO | Admitting: Physical Therapy

## 2015-09-26 DIAGNOSIS — M25612 Stiffness of left shoulder, not elsewhere classified: Secondary | ICD-10-CM

## 2015-09-26 DIAGNOSIS — M25512 Pain in left shoulder: Secondary | ICD-10-CM | POA: Diagnosis not present

## 2015-09-26 NOTE — Therapy (Signed)
[facility] [address] [place], [place], [address] Phone: [phone]   Fax:  [phone]  Physical Therapy Treatment  Patient Details  Name: Ariel Ramirez MRN: [id number] Date of Birth: November 17, 1960 Referring Provider: Pati Gallo MD.  Encounter Date: 09/26/2015      PT End of Session - 09/26/15 0812    Visit Number 3   Number of Visits 12   Date for PT Re-Evaluation 10/17/15   PT Start Time 0729   PT Stop Time 0826   PT Time Calculation (min) 57 min   Activity Tolerance Patient tolerated treatment well   Behavior During Therapy [facility] for tasks assessed/performed      Past Medical History  Diagnosis Date  . Hypothyroid   . Anxiety   . Depression   . Arthritis     Past Surgical History  Procedure Laterality Date  . Partial hysterectomy      2011  . Tubal ligation      20 yrs ago  . Esophagogastroduodenoscopy  11/09/2011    Procedure: ESOPHAGOGASTRODUODENOSCOPY (EGD);  Surgeon: Malissa Hippo, MD;  [facility];  Service: Endoscopy;  Laterality: N/A;  730  . Cholecystectomy  11/13/2011    Procedure: LAPAROSCOPIC CHOLECYSTECTOMY;  Surgeon: Dalia Heading, MD;  Location: [facility];  Service: General;  Laterality: N/A;    There were no vitals filed for this visit.      Subjective Assessment - 09/26/15 0737    Subjective Patient reported some increased pain when movint shoulder into shoulder abduction   Patient Stated Goals Use my right arm without pain.   Currently in Pain? Yes   Pain Score 6    Pain Location Shoulder   Pain Orientation Left   Pain Descriptors / Indicators Aching;Throbbing   Pain Type Acute pain   Pain Onset More than a month ago   Pain Frequency Constant   Aggravating Factors  shoulder abduction   Pain Relieving Factors with arm at rest                         [facility] Adult PT Treatment/Exercise - 09/26/15 0001    Shoulder Exercises: Standing   External Rotation  Strengthening;Left  3x10   Theraband Level (Shoulder External Rotation) Level 1 (Yellow)   Internal Rotation Strengthening;Left;Theraband  3x10   Theraband Level (Shoulder Internal Rotation) Level 1 (Yellow)   Flexion Strengthening;Left;Weights  for flexion and scaption 3x10 each   Shoulder Flexion Weight (lbs) 1   Extension Strengthening;Left  3x10   Theraband Level (Shoulder Extension) Level 1 (Yellow)   Row Strengthening;Left;Theraband  3x10   Theraband Level (Shoulder Row) Level 1 (Yellow)   Shoulder Exercises: Pulleys   Flexion 2 minutes   Shoulder Exercises: ROM/Strengthening   UBE (Upper Arm Bike) 120 RPM x 6 min   Modalities   Modalities Electrical Stimulation;Moist Heat;Ultrasound   Moist Heat Therapy   Number Minutes Moist Heat 15 Minutes   Moist Heat Location Shoulder   Electrical Stimulation   Electrical Stimulation Location Left shoulder.   Electrical Stimulation Action premod   Electrical Stimulation Parameters 80-150   Electrical Stimulation Goals Pain   Ultrasound   Ultrasound Location left acromial ridge    Ultrasound Parameters 1.5w/cm2/50%/3.40mhzx8min   Ultrasound Goals Pain                     PT Long Term Goals - 09/26/15 0813    PT LONG TERM GOAL #  1   Title Independent with a HEP.   Time 4   Period Weeks   Status On-going   PT LONG TERM GOAL #2   Title Active left shoulder flexion to 155 degrees so the patient can easily reach overhead   Time 4   Period Weeks   Status On-going  AROM 144 degrees 09/26/15   PT LONG TERM GOAL #3   Title Active left shoulder ER to 80 degrees+ to allow for easily donning/doffing of apparel   Time 4   Period Weeks   Status On-going   PT LONG TERM GOAL #4   Title Perform ADL's with pain not > 3/10.   Time 4   Period Weeks   Status On-going   PT LONG TERM GOAL #5   Title Increase ROM so patient is able to reach behind back to L3.   Time 4   Period Weeks   Status On-going                Plan - 09/26/15 0818    Clinical Impression Statement Patient progressing with good response to treatments thus far. Patient has had increased pain after some ADL's reaching in to do her laundry and any movement into shoulder abd. Patient had good technique with exercises today and no pain within  modifications. Patient goals ongoing due to pain and ROM deficits.   Rehab Potential Excellent   PT Frequency 2x / week   PT Duration 6 weeks   PT Treatment/Interventions ADLs/Self Care Home Management;Cryotherapy;Electrical Stimulation;Therapeutic exercise;Therapeutic activities;Ultrasound;Patient/family education;Manual techniques;Passive range of motion;Dry needling;Moist Heat   PT Next Visit Plan Combo e'stim/U/S; gentle stretching to include capsular stretching; RW4 and scapular strengthening.   Consulted and Agree with Plan of Care Patient      Patient will benefit from skilled therapeutic intervention in order to improve the following deficits and impairments:  Pain, Decreased range of motion, Decreased activity tolerance  Visit Diagnosis: Pain in left shoulder  Stiffness of left shoulder, not elsewhere classified     Problem List Patient Active Problem List   Diagnosis Date Noted  . Unspecified constipation 12/22/2012  . Bipolar disorder, unspecified (HCC) 05/25/2012  . Post traumatic stress disorder (PTSD) 05/25/2012  . Arthritis 05/25/2012  . Depression 05/25/2012  . GERD (gastroesophageal reflux disease) 10/26/2011  . Abdominal pain, chronic, right upper quadrant 09/25/2011  . HYPOTHYROIDISM 01/22/2010  . UNSPECIFIED ANEMIA 01/22/2010  . ALOPECIA 01/22/2010  . CRAMP OF LIMB 01/22/2010    Ona Roehrs P, PTA 09/26/2015, 8:28 AM  [facility] [address] [place], [place], [address] Phone: [phone]   Fax:  [phone]  Name: Ariel Ramirez MRN: [id number] Date of Birth: 01-02-1961

## 2015-09-30 ENCOUNTER — Encounter: Payer: Self-pay | Admitting: Physical Therapy

## 2015-10-01 ENCOUNTER — Encounter: Payer: Self-pay | Admitting: *Deleted

## 2015-10-03 ENCOUNTER — Encounter: Payer: Self-pay | Admitting: Physical Therapy

## 2015-10-15 ENCOUNTER — Ambulatory Visit: Payer: Self-pay | Admitting: Physical Therapy

## 2015-10-16 ENCOUNTER — Ambulatory Visit: Payer: Federal, State, Local not specified - PPO | Attending: Sports Medicine | Admitting: Physical Therapy

## 2015-10-16 ENCOUNTER — Encounter: Payer: Self-pay | Admitting: Physical Therapy

## 2015-10-16 DIAGNOSIS — M25512 Pain in left shoulder: Secondary | ICD-10-CM | POA: Diagnosis not present

## 2015-10-16 DIAGNOSIS — M25612 Stiffness of left shoulder, not elsewhere classified: Secondary | ICD-10-CM | POA: Diagnosis present

## 2015-10-16 NOTE — Therapy (Signed)
[facility] [address] [place], [place], [address] Phone: [phone]   Fax:  [phone]  Physical Therapy Treatment  Patient Details  Name: Ariel Ramirez MRN: [id number] Date of Birth: 06-13-1960 Referring Provider: Pati Gallo MD.  Encounter Date: 10/16/2015      PT End of Session - 10/16/15 0819    Visit Number 4   Number of Visits 12   Date for PT Re-Evaluation 10/17/15   PT Start Time 0818   PT Stop Time 0904   PT Time Calculation (min) 46 min   Activity Tolerance Patient tolerated treatment well   Behavior During Therapy [facility] for tasks assessed/performed      Past Medical History:  Diagnosis Date  . Anxiety   . Arthritis   . Depression   . Hypothyroid     Past Surgical History:  Procedure Laterality Date  . CHOLECYSTECTOMY  11/13/2011   Procedure: LAPAROSCOPIC CHOLECYSTECTOMY;  Surgeon: Dalia Heading, MD;  Location: [facility];  Service: General;  Laterality: N/A;  . ESOPHAGOGASTRODUODENOSCOPY  11/09/2011   Procedure: ESOPHAGOGASTRODUODENOSCOPY (EGD);  Surgeon: Malissa Hippo, MD;  [facility];  Service: Endoscopy;  Laterality: N/A;  730  . PARTIAL HYSTERECTOMY     2011  . TUBAL LIGATION     20 yrs ago    There were no vitals filed for this visit.      Subjective Assessment - 10/16/15 0817    Subjective Reports that her ROM is getting a little better but still hurting up to 8-9/10 depending on degree of movement. States that she has been working on L shoulder circles.   Patient Stated Goals Use my right arm without pain.   Currently in Pain? Yes  with intermittant LUE actviity            [facility] PT Assessment - 10/16/15 0001      Assessment   Medical Diagnosis Left rotator cuff tendonitis.   Next MD Visit Depending on PT results     Precautions   Precautions None     Restrictions   Weight Bearing Restrictions No                     [facility] Adult PT Treatment/Exercise -  10/16/15 0001      Shoulder Exercises: Standing   External Rotation Strengthening;Left;20 reps;Theraband   Theraband Level (Shoulder External Rotation) Level 1 (Yellow)   Internal Rotation Strengthening;Left;20 reps;Theraband   Theraband Level (Shoulder Internal Rotation) Level 1 (Yellow)   Flexion Strengthening;Both;Weights   Shoulder Flexion Weight (lbs) 1   Extension Strengthening;Left;20 reps;Theraband   Theraband Level (Shoulder Extension) Level 1 (Yellow)   Row Strengthening;Left;20 reps;Theraband   Theraband Level (Shoulder Row) Level 1 (Yellow)   Other Standing Exercises LUE ranger flex/circles x20 reps   Other Standing Exercises B shoulder scaption 1# x20 reps     Shoulder Exercises: ROM/Strengthening   UBE (Upper Arm Bike) 120 RPM x5 min     Modalities   Modalities Electrical Stimulation;Moist Heat;Ultrasound     Moist Heat Therapy   Number Minutes Moist Heat 15 Minutes   Moist Heat Location Shoulder     Electrical Stimulation   Electrical Stimulation Location L shoulder   Electrical Stimulation Action Pre-Mod   Electrical Stimulation Parameters 80-150 hz x15 min   Electrical Stimulation Goals Pain     Ultrasound   Ultrasound Location L acromian ridge   Ultrasound Parameters 1.2 w/cm2, 100%, 3.3 mhz x10 min   Ultrasound  Goals Pain                     PT Long Term Goals - 09/26/15 0813      PT LONG TERM GOAL #1   Title Independent with a HEP.   Time 4   Period Weeks   Status On-going     PT LONG TERM GOAL #2   Title Active left shoulder flexion to 155 degrees so the patient can easily reach overhead   Time 4   Period Weeks   Status On-going  AROM 144 degrees 09/26/15     PT LONG TERM GOAL #3   Title Active left shoulder ER to 80 degrees+ to allow for easily donning/doffing of apparel   Time 4   Period Weeks   Status On-going     PT LONG TERM GOAL #4   Title Perform ADL's with pain not > 3/10.   Time 4   Period Weeks   Status  On-going     PT LONG TERM GOAL #5   Title Increase ROM so patient is able to reach behind back to L3.   Time 4   Period Weeks   Status On-going               Plan - 10/16/15 0855    Clinical Impression Statement Patient continues to present in clinic with reports of L shoulder pain especially into L anterior humerus region. Patient did experience posterior L shoulder discomfort with resisted L shoulder row. Patient continues to be limited with L shoulder activitiy and cannot sleep on L shoulder at this time. Patient continues to experience L shoulder discomfort with movement into extension. Normal modalities response noted following removal of the modalities. All goals are on-going secondary to ROM limitations and pain.   Rehab Potential Excellent   PT Frequency 2x / week   PT Duration 6 weeks   PT Treatment/Interventions ADLs/Self Care Home Management;Cryotherapy;Electrical Stimulation;Therapeutic exercise;Therapeutic activities;Ultrasound;Patient/family education;Manual techniques;Passive range of motion;Dry needling;Moist Heat   PT Next Visit Plan Continue as symptoms dictate with modalities PRN per MPT POC.   PT Home Exercise Plan HEP- Resisted ER, row, horizontal abduction, supine B ER with yellow theraband   Consulted and Agree with Plan of Care Patient      Patient will benefit from skilled therapeutic intervention in order to improve the following deficits and impairments:  Pain, Decreased range of motion, Decreased activity tolerance  Visit Diagnosis: Pain in left shoulder  Stiffness of left shoulder, not elsewhere classified     Problem List Patient Active Problem List   Diagnosis Date Noted  . Unspecified constipation 12/22/2012  . Bipolar disorder, unspecified (HCC) 05/25/2012  . Post traumatic stress disorder (PTSD) 05/25/2012  . Arthritis 05/25/2012  . Depression 05/25/2012  . GERD (gastroesophageal reflux disease) 10/26/2011  . Abdominal pain, chronic,  right upper quadrant 09/25/2011  . HYPOTHYROIDISM 01/22/2010  . UNSPECIFIED ANEMIA 01/22/2010  . ALOPECIA 01/22/2010  . CRAMP OF LIMB 01/22/2010    Evelene Croon, PTA 10/16/2015, 9:11 AM  [facility] [address] [place], [place], [address] Phone: [phone]   Fax:  [phone]  Name: YINA GEDDES MRN: [id number] Date of Birth: Mar 25, 1960

## 2015-10-22 ENCOUNTER — Ambulatory Visit: Payer: Federal, State, Local not specified - PPO | Admitting: Physical Therapy

## 2015-10-24 ENCOUNTER — Encounter: Payer: Self-pay | Admitting: Physical Therapy

## 2015-10-28 ENCOUNTER — Encounter: Payer: Self-pay | Admitting: Physical Therapy

## 2015-10-28 ENCOUNTER — Ambulatory Visit: Payer: Federal, State, Local not specified - PPO | Admitting: Physical Therapy

## 2015-10-28 DIAGNOSIS — M25512 Pain in left shoulder: Secondary | ICD-10-CM | POA: Diagnosis not present

## 2015-10-28 DIAGNOSIS — M25612 Stiffness of left shoulder, not elsewhere classified: Secondary | ICD-10-CM

## 2015-10-28 NOTE — Therapy (Signed)
[facility] [address] [place], [place], [address] Phone: [phone]   Fax:  [phone]  Physical Therapy Treatment  Patient Details  Name: Ariel Ramirez MRN: [id number] Date of Birth: 07/05/1960 Referring Provider: Pati GalloJames Kramer MD.  Encounter Date: 10/28/2015      PT End of Session - 10/28/15 60100822    Visit Number 5   Number of Visits 12   Date for PT Re-Evaluation 10/31/15   PT Start Time 0819   PT Stop Time 0909   PT Time Calculation (min) 50 min   Activity Tolerance Patient tolerated treatment well   Behavior During Therapy [facility] for tasks assessed/performed      Past Medical History:  Diagnosis Date  . Anxiety   . Arthritis   . Depression   . Hypothyroid     Past Surgical History:  Procedure Laterality Date  . CHOLECYSTECTOMY  11/13/2011   Procedure: LAPAROSCOPIC CHOLECYSTECTOMY;  Surgeon: Dalia HeadingMark A Jenkins, MD;  Location: [facility];  Service: General;  Laterality: N/A;  . ESOPHAGOGASTRODUODENOSCOPY  11/09/2011   Procedure: ESOPHAGOGASTRODUODENOSCOPY (EGD);  Surgeon: Malissa HippoNajeeb U Rehman, MD;  Location: [facility];  Service: Endoscopy;  Laterality: N/A;  730  . PARTIAL HYSTERECTOMY     2011  . TUBAL LIGATION     20 yrs ago    There were no vitals filed for this visit.      Subjective Assessment - 10/28/15 0821    Subjective Reports that her shoulder is now starting to hurt even when she is not moving her shoulder. Hurting down into L upper arm and has an appointment with MD next week.   Patient Stated Goals Use my right arm without pain.   Currently in Pain? Yes   Pain Score 3    Pain Location Shoulder   Pain Orientation Left   Pain Descriptors / Indicators Dull;Aching   Pain Type Acute pain   Pain Onset More than a month ago            [facility] 10/28/15 0001      Assessment   Medical Diagnosis Left rotator cuff tendonitis.   Next MD Visit 11/04/2015     Precautions   Precautions None      Restrictions   Weight Bearing Restrictions No                     [facility] Adult PT Treatment/Exercise - 10/28/15 0001      Shoulder Exercises: Standing   External Rotation Strengthening;Left;20 reps;Theraband   Theraband Level (Shoulder External Rotation) Level 1 (Yellow)   Internal Rotation Strengthening;Left;20 reps;Theraband   Theraband Level (Shoulder Internal Rotation) Level 1 (Yellow)   Extension Strengthening;Left;20 reps;Theraband   Theraband Level (Shoulder Extension) Level 1 (Yellow)   Row Strengthening;Left;20 reps;Theraband   Theraband Level (Shoulder Row) Level 1 (Yellow)   Other Standing Exercises L shoulder wall ladder (107 deg at pain)  x0 reps     Shoulder Exercises: Pulleys   Flexion 3 minutes     Shoulder Exercises: ROM/Strengthening   UBE (Upper Arm Bike) 120 RPM x7 min     Modalities   Modalities Electrical Stimulation;Moist Heat;Ultrasound     Moist Heat Therapy   Number Minutes Moist Heat 15 Minutes   Moist Heat Location Shoulder     Electrical Stimulation   Electrical Stimulation Location L shoulder   Electrical Stimulation Action Pre-Mod   Electrical Stimulation Parameters 80-150 hz x15 min   Electrical Stimulation Goals  Pain     Ultrasound   Ultrasound Location L acromian ridge   Ultrasound Parameters 1.2 w/cm2, 100%, 1 mhz x10 min   Ultrasound Goals Pain                     PT Long Term Goals - 09/26/15 0813      PT LONG TERM GOAL #1   Title Independent with a HEP.   Time 4   Period Weeks   Status On-going     PT LONG TERM GOAL #2   Title Active left shoulder flexion to 155 degrees so the patient can easily reach overhead   Time 4   Period Weeks   Status On-going  AROM 144 degrees 09/26/15     PT LONG TERM GOAL #3   Title Active left shoulder ER to 80 degrees+ to allow for easily donning/doffing of apparel   Time 4   Period Weeks   Status On-going     PT LONG TERM GOAL #4   Title Perform ADL's with  pain not > 3/10.   Time 4   Period Weeks   Status On-going     PT LONG TERM GOAL #5   Title Increase ROM so patient is able to reach behind back to L3.   Time 4   Period Weeks   Status On-going               Plan - 10/28/15 0857    Clinical Impression Statement Patient now presenting in clinic with pain in L shoulder at rest and still limited secondary to pain. Patient reported discomfort with UBE as well as standing wall ladder. 107 deg of flexion with LUE at wall ladder that patient experienced discomfort at. Patient had no complaints of discomfort with yellow theraband exercises today. Normal modalities response noted following removal of the modalities. Goals remain on-going at this time secondary to pain and ROM limitations.   Rehab Potential Excellent   PT Frequency 2x / week   PT Duration 6 weeks   PT Treatment/Interventions ADLs/Self Care Home Management;Cryotherapy;Electrical Stimulation;Therapeutic exercise;Therapeutic activities;Ultrasound;Patient/family education;Manual techniques;Passive range of motion;Dry needling;Moist Heat   PT Next Visit Plan Continue as symptoms dictate with modalities PRN per MPT POC.   PT Home Exercise Plan HEP- Resisted ER, row, horizontal abduction, supine B ER with yellow theraband   Consulted and Agree with Plan of Care Patient      Patient will benefit from skilled therapeutic intervention in order to improve the following deficits and impairments:  Pain, Decreased range of motion, Decreased activity tolerance  Visit Diagnosis: Pain in left shoulder  Stiffness of left shoulder, not elsewhere classified     Problem List Patient Active Problem List   Diagnosis Date Noted  . Unspecified constipation 12/22/2012  . Bipolar disorder, unspecified (HCC) 05/25/2012  . Post traumatic stress disorder (PTSD) 05/25/2012  . Arthritis 05/25/2012  . Depression 05/25/2012  . GERD (gastroesophageal reflux disease) 10/26/2011  . Abdominal  pain, chronic, right upper quadrant 09/25/2011  . HYPOTHYROIDISM 01/22/2010  . UNSPECIFIED ANEMIA 01/22/2010  . ALOPECIA 01/22/2010  . CRAMP OF LIMB 01/22/2010    Evelene CroonKelsey M Parsons, PTA 10/28/2015, 9:24 AM  [facility] [address] [place], [place], [address] Phone: [phone]   Fax:  [phone]  Name: Ariel Matarngela C Laforte MRN: [id number] Date of Birth: 1960-09-02

## 2015-10-30 ENCOUNTER — Encounter: Payer: Federal, State, Local not specified - PPO | Admitting: Physical Therapy

## 2015-11-08 ENCOUNTER — Other Ambulatory Visit: Payer: Self-pay | Admitting: Sports Medicine

## 2015-11-08 DIAGNOSIS — M25512 Pain in left shoulder: Secondary | ICD-10-CM

## 2015-11-16 ENCOUNTER — Ambulatory Visit
Admission: RE | Admit: 2015-11-16 | Discharge: 2015-11-16 | Disposition: A | Discharge status: Home / Self Care | Payer: Federal, State, Local not specified - PPO | Source: Ambulatory Visit | Attending: Sports Medicine | Admitting: Sports Medicine

## 2015-11-16 DIAGNOSIS — M25512 Pain in left shoulder: Secondary | ICD-10-CM

## 2016-02-13 ENCOUNTER — Ambulatory Visit: Payer: Federal, State, Local not specified - PPO | Attending: Orthopedic Surgery | Admitting: Physical Therapy

## 2016-02-13 DIAGNOSIS — M25612 Stiffness of left shoulder, not elsewhere classified: Secondary | ICD-10-CM | POA: Insufficient documentation

## 2016-02-13 DIAGNOSIS — M25512 Pain in left shoulder: Secondary | ICD-10-CM | POA: Diagnosis not present

## 2016-02-13 NOTE — Therapy (Signed)
[facility] [address] [place], [place], [address] Phone: [phone]   Fax:  [phone]  Physical Therapy Evaluation  Patient Details  Name: Ariel Ramirez MRN: [id number] Date of Birth: Jul 20, 1960 Referring Provider: Salvatore Marvel MD.  Encounter Date: 02/13/2016      PT End of Session - 02/13/16 1228    Visit Number 1   Number of Visits 16   Date for PT Re-Evaluation 04/09/16   PT Start Time 0952   PT Stop Time 1044   PT Time Calculation (min) 52 min   Activity Tolerance Patient tolerated treatment well   Behavior During Therapy [facility] for tasks assessed/performed      Past Medical History:  Diagnosis Date  . Anxiety   . Arthritis   . Depression   . Hypothyroid     Past Surgical History:  Procedure Laterality Date  . CHOLECYSTECTOMY  11/13/2011   Procedure: LAPAROSCOPIC CHOLECYSTECTOMY;  Surgeon: Dalia Heading, MD;  Location: [facility];  Service: General;  Laterality: N/A;  . ESOPHAGOGASTRODUODENOSCOPY  11/09/2011   Procedure: ESOPHAGOGASTRODUODENOSCOPY (EGD);  Surgeon: Malissa Hippo, MD;  [facility];  Service: Endoscopy;  Laterality: N/A;  730  . PARTIAL HYSTERECTOMY     2011  . TUBAL LIGATION     20 yrs ago    There were no vitals filed for this visit.       Subjective Assessment - 02/13/16 1223    Subjective The patient presents to [facility] s/p left shoulder manipulation with scope with lysis of adhesions. Her surgery was 02/12/16.  She still has her post-surgical dressing on and is in a sling.  Her resting pain-level is a 4/10.  Her husband is going to perform PROM to her right shoulder and is obtaining an over the door pulley for her.             Patient is accompained by: Family member  Husband.   Patient Stated Goals Use my right arm without pain.   Pain Score 4    Pain Location Shoulder   Pain Orientation Left   Pain Descriptors / Indicators Aching   Pain Type Surgical pain   Pain Onset More  than a month ago   Pain Frequency Constant   Aggravating Factors  Movement.   Pain Relieving Factors Resting in sling.            [facility] PT Assessment - 02/13/16 0001      Assessment   Medical Diagnosis Left shoulder manipulation.   Referring Provider Salvatore Marvel MD.     Precautions   Precautions None     Restrictions   Weight Bearing Restrictions No     Balance Screen   Has the patient fallen in the past 6 months No   Has the patient had a decrease in activity level because of a fear of falling?  No   Is the patient reluctant to leave their home because of a fear of falling?  No     Home Environment   Living Environment Private residence     Prior Function   Level of Independence Independent     Observation/Other Assessments   Observations Left shoulder post-surgical dressing on.     ROM / Strength   AROM / PROM / Strength PROM     PROM   Overall PROM Comments In supine left shoulder flexion= 77 degrees; ER= 22 degrees and IR ro abdomen.     Strength   Overall Strength  Comments NT.     Palpation   Palpation comment C/o diffuse left shoulder pain.                   [facility] Adult PT Treatment/Exercise - 02/13/16 0001      Exercises   Exercises Shoulder     Modalities   Modalities Vasopneumatic     Vasopneumatic   Number Minutes Vasopneumatic  10 minutes   Vasopnuematic Location  --  Left shoulder while seated.   Vasopneumatic Pressure Medium     Manual Therapy   Manual Therapy Passive ROM   Manual therapy comments In supine:  Left shoulder PROM into flexion and ER in the plane of the scapula x 13 minutes.                     PT Long Term Goals - 02/13/16 1233      PT LONG TERM GOAL #1   Title Independent with a HEP.   Time 8   Period Weeks   Status New     PT LONG TERM GOAL #2   Title Active left shoulder flexion to 155 degrees so the patient can easily reach overhead   Time 8   Period Weeks   Status New     PT  LONG TERM GOAL #3   Title Active left shoulder ER to 80 degrees+ to allow for easily donning/doffing of apparel   Time 8   Period Weeks   Status New     PT LONG TERM GOAL #4   Title Perform ADL's with pain not > 3/10.   Time 8   Period Weeks   Status New     PT LONG TERM GOAL #5   Title Increase ROM so patient is able to reach behind back to L3.   Time 8   Period Weeks   Status New               Plan - 02/13/16 1231    Clinical Impression Statement As expected the patient has a significant loss of left shoulder range motion.  However, she tolerated PROM very well today.     Rehab Potential Excellent   PT Frequency 2x / week   PT Duration 8 weeks   PT Treatment/Interventions ADLs/Self Care Home Management;Cryotherapy;Electrical Stimulation;Therapeutic activities;Therapeutic exercise;Neuromuscular re-education;Patient/family education;Passive range of motion;Manual techniques;Vasopneumatic Device   PT Next Visit Plan PROM to left shoulder.  See MD order under 'Media" tab.      Patient will benefit from skilled therapeutic intervention in order to improve the following deficits and impairments:  Pain, Decreased range of motion, Decreased activity tolerance  Visit Diagnosis: Acute pain of left shoulder - Plan: PT plan of care cert/re-cert  Stiffness of left shoulder, not elsewhere classified - Plan: PT plan of care cert/re-cert     Problem List Patient Active Problem List   Diagnosis Date Noted  . Unspecified constipation 12/22/2012  . Bipolar disorder, unspecified 05/25/2012  . Post traumatic stress disorder (PTSD) 05/25/2012  . Arthritis 05/25/2012  . Depression 05/25/2012  . GERD (gastroesophageal reflux disease) 10/26/2011  . Abdominal pain, chronic, right upper quadrant 09/25/2011  . HYPOTHYROIDISM 01/22/2010  . UNSPECIFIED ANEMIA 01/22/2010  . ALOPECIA 01/22/2010  . CRAMP OF LIMB 01/22/2010    Shontia Gillooly, [place] MPT 02/13/2016, 12:55 PM  [facility] [address] [place], [place], [address] Phone: [phone]   Fax:  [phone]  Name: Ariel Ramirez MRN: [id number] Date  of Birth: 1960/12/20

## 2016-02-17 ENCOUNTER — Ambulatory Visit: Payer: Federal, State, Local not specified - PPO | Attending: Orthopedic Surgery | Admitting: Physical Therapy

## 2016-02-17 ENCOUNTER — Encounter: Payer: Self-pay | Admitting: Physical Therapy

## 2016-02-17 DIAGNOSIS — M25512 Pain in left shoulder: Secondary | ICD-10-CM | POA: Insufficient documentation

## 2016-02-17 DIAGNOSIS — M25612 Stiffness of left shoulder, not elsewhere classified: Secondary | ICD-10-CM

## 2016-02-17 NOTE — Therapy (Signed)
[facility] [address] [place], [place], [address] Phone: [phone]   Fax:  [phone]  Physical Therapy Treatment  Patient Details  Name: Ariel Ramirez MRN: [id number] Date of Birth: 1960-09-05 Referring Provider: Salvatore Marvelobert Wainer MD.  Encounter Date: 02/17/2016      PT End of Session - 02/17/16 1439    Visit Number 2   Number of Visits 16   Date for PT Re-Evaluation 04/09/16   PT Start Time 1431   PT Stop Time 1520   PT Time Calculation (min) 49 min   Activity Tolerance Patient tolerated treatment well   Behavior During Therapy [facility] for tasks assessed/performed      Past Medical History:  Diagnosis Date  . Anxiety   . Arthritis   . Depression   . Hypothyroid     Past Surgical History:  Procedure Laterality Date  . CHOLECYSTECTOMY  11/13/2011   Procedure: LAPAROSCOPIC CHOLECYSTECTOMY;  Surgeon: Dalia HeadingMark A Jenkins, MD;  Location: [facility];  Service: General;  Laterality: N/A;  . ESOPHAGOGASTRODUODENOSCOPY  11/09/2011   Procedure: ESOPHAGOGASTRODUODENOSCOPY (EGD);  Surgeon: Malissa HippoNajeeb U Rehman, MD;  Location[facility];  Service: Endoscopy;  Laterality: N/A;  730  . PARTIAL HYSTERECTOMY     2011  . TUBAL LIGATION     20 yrs ago    There were no vitals filed for this visit.      Subjective Assessment - 02/17/16 1439    Subjective Reports feels good to raise arm up and out. Reports no real pain so she is in sling at times.   Patient is accompained by: Family member  Husband   Patient Stated Goals Use my right arm without pain.   Currently in Pain? No/denies            [facility] - 02/17/16 0001      Assessment   Medical Diagnosis Left shoulder manipulation.   Onset Date/Surgical Date 02/12/16   Next MD Visit 02/20/2016     Precautions   Precautions None     Restrictions   Weight Bearing Restrictions No     ROM / Strength   AROM / PROM / Strength PROM     PROM   Overall PROM  Deficits   PROM  Assessment Site Shoulder   Right/Left Shoulder Left   Left Shoulder External Rotation 68 Degrees                     [facility] Adult PT Treatment/Exercise - 02/17/16 0001      Shoulder Exercises: Pulleys   Flexion Other (comment)  x6 min     Modalities   Modalities Electrical Stimulation;Vasopneumatic     Electrical Stimulation   Electrical Stimulation Location L shoulder   Electrical Stimulation Action Pre-Mod   Electrical Stimulation Parameters 80-150 hz x15 min   Electrical Stimulation Goals Pain     Vasopneumatic   Number Minutes Vasopneumatic  15 minutes   Vasopnuematic Location  Shoulder   Vasopneumatic Pressure Medium   Vasopneumatic Temperature  34     Manual Therapy   Manual Therapy Passive ROM   Passive ROM PROM of L shoulder into flexion/ER/IR with gentle holds at end range                     PT Long Term Goals - 02/13/16 1233      PT LONG TERM GOAL #1   Title Independent with a HEP.   Time 8  Period Weeks   Status New     PT LONG TERM GOAL #2   Title Active left shoulder flexion to 155 degrees so the patient can easily reach overhead   Time 8   Period Weeks   Status New     PT LONG TERM GOAL #3   Title Active left shoulder ER to 80 degrees+ to allow for easily donning/doffing of apparel   Time 8   Period Weeks   Status New     PT LONG TERM GOAL #4   Title Perform ADL's with pain not > 3/10.   Time 8   Period Weeks   Status New     PT LONG TERM GOAL #5   Title Increase ROM so patient is able to reach behind back to L3.   Time 8   Period Weeks   Status New               Plan - 02/17/16 1514    Clinical Impression Statement Patient arrived to treatment with excitement regarding her improvement since manipulation. Patient arrived with L shoulder sling donned but reports that she wears it only at times. Patient completed pulley exercise well with no reports of pain. Patient did experience L shoulder discomfort  with end range flexion and ER intermittantly. Husband present to be further educated regarding PROM of L shoulder. PROM L shoulder ER measured as 68 deg today. Firm end feels noted with PROM of L shoulder into all directions assessed. Normal modalities response noted following removal of the modalities.   Rehab Potential Excellent   PT Frequency 2x / week   PT Duration 8 weeks   PT Treatment/Interventions ADLs/Self Care Home Management;Cryotherapy;Electrical Stimulation;Therapeutic activities;Therapeutic exercise;Neuromuscular re-education;Patient/family education;Passive range of motion;Manual techniques;Vasopneumatic Device   PT Next Visit Plan PROM to left shoulder.  See MD order under 'Media" tab.   PT Home Exercise Plan HEP- Resisted ER, row, horizontal abduction, supine B ER with yellow theraband   Consulted and Agree with Plan of Care Patient      Patient will benefit from skilled therapeutic intervention in order to improve the following deficits and impairments:  Pain, Decreased range of motion, Decreased activity tolerance  Visit Diagnosis: Acute pain of left shoulder  Stiffness of left shoulder, not elsewhere classified     Problem List Patient Active Problem List   Diagnosis Date Noted  . Unspecified constipation 12/22/2012  . Bipolar disorder, unspecified 05/25/2012  . Post traumatic stress disorder (PTSD) 05/25/2012  . Arthritis 05/25/2012  . Depression 05/25/2012  . GERD (gastroesophageal reflux disease) 10/26/2011  . Abdominal pain, chronic, right upper quadrant 09/25/2011  . HYPOTHYROIDISM 01/22/2010  . UNSPECIFIED ANEMIA 01/22/2010  . ALOPECIA 01/22/2010  . CRAMP OF LIMB 01/22/2010    Evelene CroonKelsey M Parsons, PTA 02/17/2016, 3:30 PM  [facility] [address] [place], [place], [address] Phone: [phone]   Fax:  [phone]  Name: Ariel Ramirez MRN: [id number] Date of Birth: Jul 17, 1960

## 2016-02-19 ENCOUNTER — Ambulatory Visit: Payer: Federal, State, Local not specified - PPO | Admitting: Physical Therapy

## 2016-02-19 DIAGNOSIS — M25612 Stiffness of left shoulder, not elsewhere classified: Secondary | ICD-10-CM

## 2016-02-19 DIAGNOSIS — M25512 Pain in left shoulder: Secondary | ICD-10-CM | POA: Diagnosis not present

## 2016-02-19 NOTE — Therapy (Signed)
[facility] [address] [place], [place], [address] Phone: [phone]   Fax:  [phone]  Physical Therapy Treatment  Patient Details  Name: Ariel Ramirez MRN: [id number] Date of Birth: 1960-10-16 Referring Provider: Robert Wainer MD.  Encounter Date: 02/19/2016      PT End of Session - 02/19/16 0949    Visit Number 3   Number of Visits 16   Date for PT Re-Evaluation 04/09/16   PT Start Time 0946   PT Stop Time 1044   PT Time Calculation (min) 58 min   Activity Tolerance Patient tolerated treatment well   Behavior During Therapy WFL for tasks assessed/performed      Past Medical History:  Diagnosis Date  . Anxiety   . Arthritis   . Depression   . Hypothyroid     Past Surgical History:  Procedure Laterality Date  . CHOLECYSTECTOMY  11/13/2011   Procedure: LAPAROSCOPIC CHOLECYSTECTOMY;  Surgeon: Mark A Jenkins, MD;  Location: [facility];  Service: General;  Laterality: N/A;  . ESOPHAGOGASTRODUODENOSCOPY  11/09/2011   Procedure: ESOPHAGOGASTRODUODENOSCOPY (EGD);  Surgeon: Najeeb U Rehman, MD;  Location: [facility];  Service: Endoscopy;  Laterality: N/A;  730  . PARTIAL HYSTERECTOMY     2011  . TUBAL LIGATION     20 yrs ago    There were no vitals filed for this visit.      Subjective Assessment - 02/19/16 0950    Subjective Patient only reports pain when she "pushes it".    Patient is accompained by: Family member   Patient Stated Goals Use my right arm without pain.   Currently in Pain? No/denies            [facility] PT Assessment - 02/19/16 0001      ROM / Strength   AROM / PROM / Strength AROM;PROM     AROM   AROM Assessment Site Shoulder   Right/Left Shoulder Left   Left Shoulder Flexion 129 Degrees  standing; 152 supine   Left Shoulder ABduction 145 Degrees  scaption   Left Shoulder Internal Rotation --  able to reach behind back to buttock   Left Shoulder External Rotation 15 Degrees  patient  able to put hand behind head     PROM   PROM Assessment Site Shoulder   Right/Left Shoulder Left   Left Shoulder Flexion 161 Degrees  supine   Left Shoulder External Rotation 20 Degrees  at 45 deg ER;                      [facility] Adult PT Treatment/Exercise - 02/19/16 0001      Shoulder Exercises: Seated   Retraction AROM;Both;10 reps   External Rotation AROM;20 reps  robber     Shoulder Exercises: Standing   External Rotation AAROM;Left;20 reps  with cane (also done in supine)   Extension AAROM;Left;20 reps  with cane; and with elbows bent     Shoulder Exercises: Pulleys   Flexion Other (comment)  x6 min     Shoulder Exercises: Stretch   Internal Rotation Stretch 5 reps  using gait belt for AAROM     Modalities   Modalities Electrical Stimulation;Moist Heat     Electrical Stimulation   Electrical GYates DPatent at540-<MEASUREThe [facility] Center [facility]ast37<[place] DPatent at540-<MEASUREMemorial Hermann MemorialPeaYates DPatent at540-<MEASURESelect Specialty Hosp40 69ShYates DPatent at540-<MEASUREResurrection MeWalkerYates DPatent at54FisYates DPatent at540-<MEASUREH[facility] Me93<MEASUREMENWyanYates DPatent at540-<MEASUREVeterans AdminiYates DPatent at540-<MEASUREPalmsLincolnYates DPatent at540-<MEASURERoper St Francis Berke7HolYates DPatent at540-<MEASUREUpmc ChaMarco IYates DPatent at540-<MEASUREFrederick Me6 69.6[facility] LPClinicT>69.6[facility] O ElSales promotion accounE mobilization;Passive ROM   Joint Mobilization L GH post mobs Gd II  for pain   Scapular Mobilization to L scapula protraction/retraction   Passive ROM PROM to L shoulder IR/ER with TPR to subscapularis; flexion                PT Education - 02/19/16 1050    Education provided Yes   Education Details HEP and information on Dry Needling   Person(s) Educated Patient   Methods Explanation;Demonstration;Handout   Comprehension Verbalized understanding;Returned demonstration             PT Long Term Goals - 02/13/16 1233      PT LONG TERM GOAL #1   Title Independent with a HEP.   Time 8   Period Weeks   Status New     PT LONG TERM GOAL #2   Title Active left shoulder flexion to 155 degrees so the patient can easily reach overhead   Time 8   Period Weeks   Status New     PT LONG TERM GOAL #3   Title Active left shoulder ER to 80 degrees+ to allow for easily donning/doffing of apparel    Time 8   Period Weeks   Status New     PT LONG TERM GOAL #4   Title Perform ADL's with pain not > 3/10.   Time 8   Period Weeks   Status New     PT LONG TERM GOAL #5   Title Increase ROM so patient is able to reach behind back to L3.   Time 8   Period Weeks   Status New               Plan - 02/19/16 1303    Clinical Impression Statement Patient reports no pain today unless she pushes it to the limits of ROM. She did well with treatment including learing AAROM exercises for HEP. She has tightness in L subscapularis with TPs. She demonstrated decreased IR/ER today when measured at 45 deg ABD, but is still able to put her hand behind her head.    Rehab Potential Excellent   PT Frequency 2x / week   PT Treatment/Interventions ADLs/Self Care Home Management;Cryotherapy;Electrical Stimulation;Therapeutic activities;Therapeutic exercise;Neuromuscular re-education;Patient/family education;Passive range of motion;Manual techniques;Vasopneumatic Device   PT Next Visit Plan Aggressive PROM to left shoulder.  AAROM as tolerated;  See MD order under 'Media" tab.   PT Home Exercise Plan HEP: supine cane IR/ER/flex; thoracic ext; scapular retraction   Consulted and Agree with Plan of Care Patient      Patient will benefit from skilled therapeutic intervention in order to improve the following deficits and impairments:  Pain, Decreased range of motion, Decreased activity tolerance  Visit Diagnosis: Stiffness of left shoulder, not elsewhere classified  Acute pain of left shoulder     Problem List Patient Active Problem List   Diagnosis Date Noted  . Unspecified constipation 12/22/2012  . Bipolar disorder, unspecified 05/25/2012  . Post traumatic stress disorder (PTSD) 05/25/2012  . Arthritis 05/25/2012  . Depression 05/25/2012  . GERD (gastroesophageal reflux disease) 10/26/2011  . Abdominal pain, chronic, right upper quadrant 09/25/2011  . HYPOTHYROIDISM 01/22/2010  .  UNSPECIFIED ANEMIA 01/22/2010  . ALOPECIA 01/22/2010  . CRAMP OF LIMB 01/22/2010   Solon PalmJulie Ellene Bloodsaw PT 02/19/2016, 1:25 PM  [facility] [address] [place], [place], [address] Phone: [phone]   Fax:  [phone]  Name: Ariel Ramirez MRN: [id number] Date of Birth: 1960-12-27

## 2016-02-19 NOTE — Patient Instructions (Signed)
SHOULDER: External Rotation - Supine (Cane)   Hold cane with both hands. Rotate arm away from body. Keep elbow on floor and next to body.10-30 ___ reps per set, _3-4__ sets per day, ___ days per week Add towel to keep elbow at side.   Cane Exercise: Flexion   Lie on back, holding cane above chest. Keeping arms as straight as possible, lower cane toward floor beyond head.  Repeat _10-30___ times. Do _3-4___ sessions per day.     Cane Exercise: Extension / Internal Rotation   Stand holding cane behind back with both hands palm-up. Slide cane up spine toward head. Hold ____ seconds. Repeat ____ times. Do ____ sessions per day.  http://gt2.exer.us/85   Copyright  VHI. All rights reserved.  Gilmer Mor.  Cane Exercise: Extension   Stand holding cane behind back with both hands palm-up. Lift the cane away from body. Hold ____ seconds. Repeat ____ times. Do ____ sessions per day.  Robber: Scapular Retraction: Elbow Flexion (Standing)   With elbows bent to 90, pinch shoulder blades together and rotate arms out, keeping elbows bent. Repeat _10___ times per set. Do _1-3___ sets per session. Do __1__ sessions per day. May use _____ pound weights.   Thoracic Self-Mobilization Stretch (Supine)   With small rolled towel at lower ribs level, gently lie back until stretch is felt. Hold __1-5 min. Relax. Repeat ____ times per set. Do ____ sets per session. Do __1-2__ sessions per day.   Trigger Point Dry Needling  . What is Trigger Point Dry Needling (DN)? o DN is a physical therapy technique used to treat muscle pain and dysfunction. Specifically, DN helps deactivate muscle trigger points (muscle knots).  o A thin filiform needle is used to penetrate the skin and stimulate the underlying trigger point. The goal is for a local twitch response (LTR) to occur and for the trigger point to relax. No medication of any kind is injected during the procedure.   . What Does Trigger Point Dry  Needling Feel Like?  o The procedure feels different for each individual patient. Some patients report that they do not actually feel the needle enter the skin and overall the process is not painful. Very mild bleeding may occur. However, many patients feel a deep cramping in the muscle in which the needle was inserted. This is the local twitch response.   Marland Kitchen. How Will I feel after the treatment? o Soreness is normal, and the onset of soreness may not occur for a few hours. Typically this soreness does not last longer than two days.  o Bruising is uncommon, however; ice can be used to decrease any possible bruising.  o In rare cases feeling tired or nauseous after the treatment is normal. In addition, your symptoms may get worse before they get better, this period will typically not last longer than 24 hours.   . What Can I do After My Treatment? o Increase your hydration by drinking more water for the next 24 hours. o You may place ice or heat on the areas treated that have become sore, however, do not use heat on inflamed or bruised areas. Heat often brings more relief post needling. o You can continue your regular activities, but vigorous activity is not recommended initially after the treatment for 24 hours. o DN is best combined with other physical therapy such as strengthening, stretching, and other therapies.    Precautions:  In some cases, dry needling is done over the lung field. While rare, there is  a risk of pneumothorax (punctured lung). Because of this, if you ever experience shortness of breath on exertion, difficulty taking a deep breath, chest pain or a dry cough following dry needling, you should report to an emergency room and tell them that you have been dry needled over the thorax.   Solon PalmJulie Safi Culotta, PT 02/19/16 10:47 AM [facility] [address] [place], [place], [address] Phone: [phone]   Fax:  [phone]

## 2016-02-20 ENCOUNTER — Ambulatory Visit: Payer: Federal, State, Local not specified - PPO | Admitting: Physical Therapy

## 2016-02-20 ENCOUNTER — Encounter: Payer: Self-pay | Admitting: Physical Therapy

## 2016-02-20 DIAGNOSIS — M25612 Stiffness of left shoulder, not elsewhere classified: Secondary | ICD-10-CM

## 2016-02-20 DIAGNOSIS — M25512 Pain in left shoulder: Secondary | ICD-10-CM | POA: Diagnosis not present

## 2016-02-20 NOTE — Therapy (Signed)
[facility] [address] [place], [place], [address] Phone: [phone]   Fax:  [phone]  Physical Therapy Treatment  Patient Details  Name: Ariel Ramirez MRN: [id number] Date of Birth: 14-Jun-1960 Referring Provider: Salvatore Marvelobert Wainer MD.  Encounter Date: 02/20/2016      PT End of Session - 02/20/16 1816    Visit Number 4   Number of Visits 16   Date for PT Re-Evaluation 04/09/16   PT Start Time 0230   PT Stop Time 0329   PT Time Calculation (min) 59 min   Activity Tolerance Patient tolerated treatment well   Behavior During Therapy [facility]WFL for tasks assessed/performed      Past Medical History:  Diagnosis Date  . Anxiety   . Arthritis   . Depression   . Hypothyroid     Past Surgical History:  Procedure Laterality Date  . CHOLECYSTECTOMY  11/13/2011   Procedure: LAPAROSCOPIC CHOLECYSTECTOMY;  Surgeon: Dalia HeadingMark A Jenkins, MD;  Location: [facility];  Service: General;  Laterality: N/A;  . ESOPHAGOGASTRODUODENOSCOPY  11/09/2011   Procedure: ESOPHAGOGASTRODUODENOSCOPY (EGD);  Surgeon: Malissa HippoNajeeb U Rehman, MD;  [facility];  Service: Endoscopy;  Laterality: N/A;  730  . PARTIAL HYSTERECTOMY     2011  . TUBAL LIGATION     20 yrs ago    There were no vitals filed for this visit.      Subjective Assessment - 02/20/16 1809    Subjective the Dr. was very pleased.   Patient Stated Goals Use my right arm without pain.   Pain Score 4    Pain Location Shoulder   Pain Orientation Left   Pain Descriptors / Indicators Aching   Pain Type Surgical pain   Pain Onset More than a month ago                         [facility] - 02/20/16 0001      Shoulder Exercises: Pulleys   Flexion --  5 minutes.   Other Pulley Exercises UE ranger on wall x 5 minutes.   Other Pulley Exercises UBE x 6 minutes.     Modalities   Modalities Electrical Stimulation;Moist Heat     Moist Heat Therapy   Number  Minutes Moist Heat 15 Minutes   Moist Heat Location --  Left shoulder.     Programme researcher, broadcasting/film/videolectrical Stimulation   Electrical Stimulation Location --  Left shoulder.   Electrical Stimulation Action Pre-mod   Electrical Stimulation Parameters 80-150 Hz x 15 minutes.   Electrical Stimulation Goals Pain     Manual Therapy   Passive ROM Left shoulder PROM in supine x 23 minutes.                PT Education - 02/19/16 1050    Education provided Yes   Education Details HEP and information on Dry Needling   Person(s) Educated Patient   Methods Explanation;Demonstration;Handout   Comprehension Verbalized understanding;Returned demonstration             PT Long Term Goals - 02/13/16 1233      PT LONG TERM GOAL #1   Title Independent with a HEP.   Time 8   Period Weeks   Status New     PT LONG TERM GOAL #2   Title Active left shoulder flexion to 155 degrees so the patient can easily reach overhead   Time 8   Period Weeks   Status New  PT LONG TERM GOAL #3   Title Active left shoulder ER to 80 degrees+ to allow for easily donning/doffing of apparel   Time 8   Period Weeks   Status New     PT LONG TERM GOAL #4   Title Perform ADL's with pain not > 3/10.   Time 8   Period Weeks   Status New     PT LONG TERM GOAL #5   Title Increase ROM so patient is able to reach behind back to L3.   Time 8   Period Weeks   Status New               Plan - 02/19/16 1303    Clinical Impression Statement Patient reports no pain today unless she pushes it to the limits of ROM. She did well with treatment including learing AAROM exercises for HEP. She has tightness in L subscapularis with TPs. She demonstrated decreased IR/ER today when measured at 45 deg ABD, but is still able to put her hand behind her head.    Rehab Potential Excellent   PT Frequency 2x / week   PT Treatment/Interventions ADLs/Self Care Home Management;Cryotherapy;Electrical Stimulation;Therapeutic  activities;Therapeutic exercise;Neuromuscular re-education;Patient/family education;Passive range of motion;Manual techniques;Vasopneumatic Device   PT Next Visit Plan Aggressive PROM to left shoulder.  AAROM as tolerated;  See MD order under 'Media" tab.   PT Home Exercise Plan HEP: supine cane IR/ER/flex; thoracic ext; scapular retraction   Consulted and Agree with Plan of Care Patient      Patient will benefit from skilled therapeutic intervention in order to improve the following deficits and impairments:  Pain, Decreased range of motion, Decreased activity tolerance  Visit Diagnosis: Stiffness of left shoulder, not elsewhere classified  Acute pain of left shoulder     Problem List Patient Active Problem List   Diagnosis Date Noted  . Unspecified constipation 12/22/2012  . Bipolar disorder, unspecified 05/25/2012  . Post traumatic stress disorder (PTSD) 05/25/2012  . Arthritis 05/25/2012  . Depression 05/25/2012  . GERD (gastroesophageal reflux disease) 10/26/2011  . Abdominal pain, chronic, right upper quadrant 09/25/2011  . HYPOTHYROIDISM 01/22/2010  . UNSPECIFIED ANEMIA 01/22/2010  . ALOPECIA 01/22/2010  . CRAMP OF LIMB 01/22/2010    Toniya Rozar, ItalyHAD MPT 02/20/2016, 6:18 PM  [facility] [address] [place], [place], [address] Phone: [phone]   Fax:  [phone]  Name: Ariel Ramirez MRN: [id number] Date of Birth: [birth year]

## 2016-02-24 ENCOUNTER — Ambulatory Visit: Payer: Federal, State, Local not specified - PPO | Admitting: Physical Therapy

## 2016-02-24 DIAGNOSIS — M25612 Stiffness of left shoulder, not elsewhere classified: Secondary | ICD-10-CM

## 2016-02-24 DIAGNOSIS — M25512 Pain in left shoulder: Secondary | ICD-10-CM

## 2016-02-24 NOTE — Therapy (Signed)
[facility] [address] [place], [place], [address] Phone: [phone]   Fax:  [phone]  Physical Therapy Treatment  Patient Details  Name: Ariel Ramirez MRN: [id number] Date of Birth: [birth year] Referring Provider: Salvatore Marvelobert Wainer MD.  Encounter Date: 02/24/2016      PT End of Session - 02/24/16 1327    Visit Number 5   Number of Visits 16   Date for PT Re-Evaluation 04/09/16   PT Start Time 0945   PT Stop Time 1042   PT Time Calculation (min) 57 min   Activity Tolerance Patient tolerated treatment well   Behavior During Therapy [facility] LLCWFL for tasks assessed/performed      Past Medical History:  Diagnosis Date  . Anxiety   . Arthritis   . Depression   . Hypothyroid     Past Surgical History:  Procedure Laterality Date  . CHOLECYSTECTOMY  11/13/2011   Procedure: LAPAROSCOPIC CHOLECYSTECTOMY;  Surgeon: Dalia HeadingMark A Jenkins, MD;  Location: [facility];  Service: General;  Laterality: N/A;  . ESOPHAGOGASTRODUODENOSCOPY  11/09/2011   Procedure: ESOPHAGOGASTRODUODENOSCOPY (EGD);  Surgeon: Malissa HippoNajeeb U Rehman, MD;  [facility];  Service: Endoscopy;  Laterality: N/A;  730  . PARTIAL HYSTERECTOMY     2011  . TUBAL LIGATION     20 yrs ago    There were no vitals filed for this visit.      Subjective Assessment - 02/24/16 1328    Subjective I'm working hrad.   Pain Score 4    Pain Location Shoulder   Pain Orientation Left   Pain Descriptors / Indicators Aching   Pain Type Surgical pain   Pain Onset More than a month ago     Treatment:  UBE x 10 minutes f/b Pulleys x 6 minutes f/b UE ranger on wall x 6 minutes f/b PROM to left shoulder flexion and ER x 17 minutes f/b HMP and IFC x 15 minutes.  Excellent job today.                                 PT Long Term Goals - 02/13/16 1233      PT LONG TERM GOAL #1   Title Independent with a HEP.   Time 8   Period Weeks   Status New     PT LONG TERM  GOAL #2   Title Active left shoulder flexion to 155 degrees so the patient can easily reach overhead   Time 8   Period Weeks   Status New     PT LONG TERM GOAL #3   Title Active left shoulder ER to 80 degrees+ to allow for easily donning/doffing of apparel   Time 8   Period Weeks   Status New     PT LONG TERM GOAL #4   Title Perform ADL's with pain not > 3/10.   Time 8   Period Weeks   Status New     PT LONG TERM GOAL #5   Title Increase ROM so patient is able to reach behind back to L3.   Time 8   Period Weeks   Status New             Patient will benefit from skilled therapeutic intervention in order to improve the following deficits and impairments:  Pain, Decreased range of motion, Decreased activity tolerance  Visit Diagnosis: Stiffness of left shoulder, not elsewhere classified  Acute pain of  left shoulder     Problem List Patient Active Problem List   Diagnosis Date Noted  . Unspecified constipation 12/22/2012  . Bipolar disorder, unspecified 05/25/2012  . Post traumatic stress disorder (PTSD) 05/25/2012  . Arthritis 05/25/2012  . Depression 05/25/2012  . GERD (gastroesophageal reflux disease) 10/26/2011  . Abdominal pain, chronic, right upper quadrant 09/25/2011  . HYPOTHYROIDISM 01/22/2010  . UNSPECIFIED ANEMIA 01/22/2010  . ALOPECIA 01/22/2010  . CRAMP OF LIMB 01/22/2010    [name], Italy[place]AD MPT 02/24/2016, 1:32 PM  [facility] [address] [place], [place], [address] Phone: [phone]   Fax:  [phone]  Name: Ariel Ramirez MRN: [id number] Date of Birth: Sep 11, 1960

## 2016-02-27 ENCOUNTER — Ambulatory Visit: Payer: Federal, State, Local not specified - PPO | Admitting: *Deleted

## 2016-02-27 DIAGNOSIS — M25512 Pain in left shoulder: Secondary | ICD-10-CM

## 2016-02-27 DIAGNOSIS — M25612 Stiffness of left shoulder, not elsewhere classified: Secondary | ICD-10-CM

## 2016-02-27 NOTE — Therapy (Signed)
[facility] [address] [place], [place], [address] Phone: [phone]   Fax:  [phone]  Physical Therapy Treatment  Patient Details  Name: Ariel Ramirez MRN: [id number] Date of Birth: Nov 27, 1960 Referring Provider: Salvatore Marvelobert Wainer MD.  Encounter Date: 02/27/2016      PT End of Session - 02/27/16 1400    Visit Number 6   Number of Visits 16   Date for PT Re-Evaluation 04/09/16   PT Start Time 0945   PT Stop Time 1044   PT Time Calculation (min) 59 min      Past Medical History:  Diagnosis Date  . Anxiety   . Arthritis   . Depression   . Hypothyroid     Past Surgical History:  Procedure Laterality Date  . CHOLECYSTECTOMY  11/13/2011   Procedure: LAPAROSCOPIC CHOLECYSTECTOMY;  Surgeon: Dalia HeadingMark A Jenkins, MD;  Location: [facility];  Service: General;  Laterality: N/A;  . ESOPHAGOGASTRODUODENOSCOPY  11/09/2011   Procedure: ESOPHAGOGASTRODUODENOSCOPY (EGD);  Surgeon: Malissa HippoNajeeb U Rehman, MD;  Location: [facility];  Service: Endoscopy;  Laterality: N/A;  730  . PARTIAL HYSTERECTOMY     2011  . TUBAL LIGATION     20 yrs ago    There were no vitals filed for this visit.      Subjective Assessment - 02/27/16 0946    Subjective I'm working hrad.  L shldr   Patient is accompained by: Family member                         [facility] Treatment/Exercise - 02/27/16 0001      Shoulder Exercises: Pulleys   Flexion --  5 minutes.   Other Pulley Exercises UE ranger on wall x 5 minutes.   Other Pulley Exercises UBE x 6 minutes.     Modalities   Modalities Electrical Stimulation;Moist Heat     Moist Heat Therapy   Number Minutes Moist Heat 15 Minutes   Moist Heat Location Shoulder     Electrical Stimulation   Electrical Stimulation Location L shoulder premod 80-150 Hz x 15min to tolerance   Electrical Stimulation Goals Pain     Manual Therapy   Passive ROM PROM of L shoulder into flexion/ abduction  /ER/IR with gentle holds at end range. Contract/ Relax  used for ER and elevation stretches                     PT Long Term Goals - 02/13/16 1233      PT LONG TERM GOAL #1   Title Independent with a HEP.   Time 8   Period Weeks   Status New     PT LONG TERM GOAL #2   Title Active left shoulder flexion to 155 degrees so the patient can easily reach overhead   Time 8   Period Weeks   Status New     PT LONG TERM GOAL #3   Title Active left shoulder ER to 80 degrees+ to allow for easily donning/doffing of apparel   Time 8   Period Weeks   Status New     PT LONG TERM GOAL #4   Title Perform ADL's with pain not > 3/10.   Time 8   Period Weeks   Status New     PT LONG TERM GOAL #5   Title Increase ROM so patient is able to reach behind back to L3.   Time 8  Period Weeks   Status New               Plan - 02/27/16 1400    Clinical Impression Statement Pt did fairly well with Rx today and continues to progress with ROM for LT shldr. IR HBB is her greatest challenge right now and causes her the most pain. Pt did well with contract / relax stretching for elevation and ER.  LTGs are ongoing   Rehab Potential Excellent   PT Frequency 2x / week   PT Duration 8 weeks   PT Treatment/Interventions ADLs/Self Care Home Management;Cryotherapy;Electrical Stimulation;Therapeutic activities;Therapeutic exercise;Neuromuscular re-education;Patient/family education;Passive range of motion;Manual techniques;Vasopneumatic Device   PT Next Visit Plan Aggressive PROM to left shoulder.  AAROM as tolerated;  See MD order under 'Media" tab.   PT Home Exercise Plan HEP: supine cane IR/ER/flex; thoracic ext; scapular retraction   Consulted and Agree with Plan of Care Patient      Patient will benefit from skilled therapeutic intervention in order to improve the following deficits and impairments:  Pain, Decreased range of motion, Decreased activity tolerance  Visit  Diagnosis: Acute pain of left shoulder  Stiffness of left shoulder, not elsewhere classified     Problem List Patient Active Problem List   Diagnosis Date Noted  . Unspecified constipation 12/22/2012  . Bipolar disorder, unspecified 05/25/2012  . Post traumatic stress disorder (PTSD) 05/25/2012  . Arthritis 05/25/2012  . Depression 05/25/2012  . GERD (gastroesophageal reflux disease) 10/26/2011  . Abdominal pain, chronic, right upper quadrant 09/25/2011  . HYPOTHYROIDISM 01/22/2010  . UNSPECIFIED ANEMIA 01/22/2010  . ALOPECIA 01/22/2010  . CRAMP OF LIMB 01/22/2010    Harshaan Whang,CHRIS, PTA 02/27/2016, 2:10 PM  [facility] [address] [place], [place], [address] Phone: [phone]   Fax:  [phone]  Name: Ariel Matarngela C Mahler MRN: [id number] Date of Birth: 1960-04-24

## 2016-02-28 ENCOUNTER — Encounter: Payer: Self-pay | Admitting: Physical Therapy

## 2016-02-28 ENCOUNTER — Ambulatory Visit: Payer: Federal, State, Local not specified - PPO | Admitting: Physical Therapy

## 2016-02-28 DIAGNOSIS — M25512 Pain in left shoulder: Secondary | ICD-10-CM | POA: Diagnosis not present

## 2016-02-28 DIAGNOSIS — M25612 Stiffness of left shoulder, not elsewhere classified: Secondary | ICD-10-CM

## 2016-02-28 NOTE — Therapy (Signed)
[facility] [address] [place], [place], [address] Phone: [phone]   Fax:  [phone]  Physical Therapy Treatment  Patient Details  Name: Ariel Ramirez Oravec MRN: [id number] Date of Birth: 24-Apr-1960 Referring Provider: Salvatore Marvelobert Wainer MD.  Encounter Date: 02/28/2016      PT End of Session - 02/28/16 1237    Visit Number 7   Number of Visits 16   Date for PT Re-Evaluation 04/09/16   PT Start Time 0945   PT Stop Time 1045   PT Time Calculation (min) 60 min   Activity Tolerance Patient tolerated treatment well   Behavior During Therapy [facility] for tasks assessed/performed      Past Medical History:  Diagnosis Date  . Anxiety   . Arthritis   . Depression   . Hypothyroid     Past Surgical History:  Procedure Laterality Date  . CHOLECYSTECTOMY  11/13/2011   Procedure: LAPAROSCOPIC CHOLECYSTECTOMY;  Surgeon: Dalia HeadingMark A Jenkins, MD;  Location: [facility];  Service: General;  Laterality: N/A;  . ESOPHAGOGASTRODUODENOSCOPY  11/09/2011   Procedure: ESOPHAGOGASTRODUODENOSCOPY (EGD);  Surgeon: Malissa HippoNajeeb U Rehman, MD;  [facility];  Service: Endoscopy;  Laterality: N/A;  730  . PARTIAL HYSTERECTOMY     2011  . TUBAL LIGATION     20 yrs ago    There were no vitals filed for this visit.      Subjective Assessment - 02/28/16 1045    Subjective Doing good.   Patient Stated Goals Use my right arm without pain.   Pain Score 4    Pain Location Shoulder   Pain Orientation Left   Pain Descriptors / Indicators Aching   Pain Type Surgical pain   Pain Onset More than a month ago     Treatment:  UBE at 90 RPM's x 10 minutes f/b pulleys x 5 minutes then UE Ranger on wall x 5 minutes then wall ladder x 5 minutes.  In supine performed PROM including left shoulder anterior capsular stretching x 13 minutes then HMP and IFC x 15 minutes  Excellent improvement overall.                    [facility]/Exercise -  02/28/16 0001      Shoulder Exercises: Pulleys   Flexion Limitations 5 minutes.     Shoulder Exercises: ROM/Strengthening   UBE (Upper Arm Bike) 10 minutes at 90 RPM's.                     PT Long Term Goals - 02/28/16 1239      PT LONG TERM GOAL #1   Title Independent with a HEP.   Time 8   Period Weeks   Status On-going     PT LONG TERM GOAL #2   Title Active left shoulder flexion to 155 degrees so the patient can easily reach overhead   Baseline 170 degrees in supine.   Time 8   Period Weeks   Status Achieved     PT LONG TERM GOAL #3   Title Active left shoulder ER to 80 degrees+ to allow for easily donning/doffing of apparel   Time 8   Period Weeks   Status On-going     PT LONG TERM GOAL #4   Title Perform ADL's with pain not > 3/10.   Time 8   Period Weeks     PT LONG TERM GOAL #5   Title Increase ROM so patient  is able to reach behind back to L3.   Time 8   Period Weeks   Status On-going               Plan - 02/28/16 1237    Clinical Impression Statement Patient making excellent progress with supine left shoulder flexion actively to 170 degrees and ER= 70 post-treatment.      Patient will benefit from skilled therapeutic intervention in order to improve the following deficits and impairments:  Pain, Decreased range of motion, Decreased activity tolerance  Visit Diagnosis: Acute pain of left shoulder  Stiffness of left shoulder, not elsewhere classified     Problem List Patient Active Problem List   Diagnosis Date Noted  . Unspecified constipation 12/22/2012  . Bipolar disorder, unspecified 05/25/2012  . Post traumatic stress disorder (PTSD) 05/25/2012  . Arthritis 05/25/2012  . Depression 05/25/2012  . GERD (gastroesophageal reflux disease) 10/26/2011  . Abdominal pain, chronic, right upper quadrant 09/25/2011  . HYPOTHYROIDISM 01/22/2010  . UNSPECIFIED ANEMIA 01/22/2010  . ALOPECIA 01/22/2010  . CRAMP OF LIMB  01/22/2010    Nemiah Bubar, ItalyHAD MPT 02/28/2016, 12:42 PM  [facility] [address] [place], [place], [address] Phone: [phone]   Fax:  [phone]  Name: Ariel Ramirez Brix MRN: [id number] Date of Birth: [birth year]

## 2016-03-02 ENCOUNTER — Ambulatory Visit: Payer: Federal, State, Local not specified - PPO | Admitting: Physical Therapy

## 2016-03-02 ENCOUNTER — Encounter: Payer: Self-pay | Admitting: Physical Therapy

## 2016-03-02 DIAGNOSIS — M25512 Pain in left shoulder: Secondary | ICD-10-CM | POA: Diagnosis not present

## 2016-03-02 DIAGNOSIS — M25612 Stiffness of left shoulder, not elsewhere classified: Secondary | ICD-10-CM

## 2016-03-02 NOTE — Therapy (Signed)
[facility] [address] [place], [place], [address] Phone: [phone]   Fax:  [phone]  Physical Therapy Treatment  Patient Details  Name: Ariel Ramirez MRN: [id number] Date of Birth: Mar 30, 1960 Referring Provider: Salvatore Marvelobert Wainer MD.  Encounter Date: 03/02/2016      PT End of Session - 03/02/16 1031    Visit Number 8   Number of Visits 16   Date for PT Re-Evaluation 04/09/16   PT Start Time 0945   PT Stop Time 1044   PT Time Calculation (min) 59 min   Activity Tolerance Patient tolerated treatment well   Behavior During Therapy [facility] for tasks assessed/performed      Past Medical History:  Diagnosis Date  . Anxiety   . Arthritis   . Depression   . Hypothyroid     Past Surgical History:  Procedure Laterality Date  . CHOLECYSTECTOMY  11/13/2011   Procedure: LAPAROSCOPIC CHOLECYSTECTOMY;  Surgeon: Dalia HeadingMark A Jenkins, MD;  Location: [facility];  Service: General;  Laterality: N/A;  . ESOPHAGOGASTRODUODENOSCOPY  11/09/2011   Procedure: ESOPHAGOGASTRODUODENOSCOPY (EGD);  Surgeon: Malissa HippoNajeeb U Rehman, MD;  [facility];  Service: Endoscopy;  Laterality: N/A;  730  . PARTIAL HYSTERECTOMY     2011  . TUBAL LIGATION     20 yrs ago    There were no vitals filed for this visit.      Subjective Assessment - 03/02/16 1000    Subjective patient reported continued progress   Patient is accompained by: Family member   Patient Stated Goals Use my right arm without pain.   Currently in Pain? Yes   Pain Score 1    Pain Location Shoulder   Pain Orientation Left   Pain Descriptors / Indicators Aching   Pain Type Surgical pain   Pain Onset More than a month ago   Pain Frequency Constant   Aggravating Factors  certain movements   Pain Relieving Factors at rest            [facility] PT Assessment - 03/02/16 0001      AROM   AROM Assessment Site Shoulder   Right/Left Shoulder Left   Left Shoulder External Rotation 60 Degrees      PROM   PROM Assessment Site Shoulder   Right/Left Shoulder Left   Left Shoulder External Rotation 70 Degrees                     [facility] Adult PT Treatment/Exercise - 03/02/16 0001      Shoulder Exercises: Pulleys   Flexion Other (comment)  5min   Other Pulley Exercises UE ranger for elevation and circles 3x10     Shoulder Exercises: ROM/Strengthening   UBE (Upper Arm Bike) 10min @ 90 RPM     Electrical Stimulation   Electrical Stimulation Location L shoulder premod 80-150 Hz x 15min to tolerance   Electrical Stimulation Goals Pain     Manual Therapy   Manual Therapy Passive ROM   Passive ROM PROM of L shoulder into flexion /ER/IR with gentle holds at end range.                     PT Long Term Goals - 02/28/16 1239      PT LONG TERM GOAL #1   Title Independent with a HEP.   Time 8   Period Weeks   Status On-going     PT LONG TERM GOAL #2   Title Active left  shoulder flexion to 155 degrees so the patient can easily reach overhead   Baseline 170 degrees in supine.   Time 8   Period Weeks   Status Achieved     PT LONG TERM GOAL #3   Title Active left shoulder ER to 80 degrees+ to allow for easily donning/doffing of apparel   Time 8   Period Weeks   Status On-going     PT LONG TERM GOAL #4   Title Perform ADL's with pain not > 3/10.   Time 8   Period Weeks     PT LONG TERM GOAL #5   Title Increase ROM so patient is able to reach behind back to L3.   Time 8   Period Weeks   Status On-going               Plan - 03/02/16 1033    Clinical Impression Statement Patient tolerated treatment well today. Patient has ongoing soreness in shoulder esp with increased activity with left shoulder. Patient has reported using arm for ADL's which increases soreness. Patient progressing toward goals yet ongoing due to ROM and pain deficts.   Rehab Potential Excellent   PT Frequency 2x / week   PT Duration 8 weeks   PT  Treatment/Interventions ADLs/Self Care Home Management;Cryotherapy;Electrical Stimulation;Therapeutic activities;Therapeutic exercise;Neuromuscular re-education;Patient/family education;Passive range of motion;Manual techniques;Vasopneumatic Device   PT Next Visit Plan Aggressive PROM to left shoulder.  AAROM as tolerated;  See MD order under 'Media" tab.   Consulted and Agree with Plan of Care Patient      Patient will benefit from skilled therapeutic intervention in order to improve the following deficits and impairments:  Pain, Decreased range of motion, Decreased activity tolerance  Visit Diagnosis: Acute pain of left shoulder  Stiffness of left shoulder, not elsewhere classified     Problem List Patient Active Problem List   Diagnosis Date Noted  . Unspecified constipation 12/22/2012  . Bipolar disorder, unspecified 05/25/2012  . Post traumatic stress disorder (PTSD) 05/25/2012  . Arthritis 05/25/2012  . Depression 05/25/2012  . GERD (gastroesophageal reflux disease) 10/26/2011  . Abdominal pain, chronic, right upper quadrant 09/25/2011  . HYPOTHYROIDISM 01/22/2010  . UNSPECIFIED ANEMIA 01/22/2010  . ALOPECIA 01/22/2010  . CRAMP OF LIMB 01/22/2010    Vale Peraza P, PTA 03/02/2016, 10:44 AM  [facility] [address] [place], [place], [address] Phone: [phone]   Fax:  [phone]  Name: Ariel Matarngela C Stead MRN: [id number] Date of Birth: [birth year]

## 2016-03-04 ENCOUNTER — Encounter: Payer: Self-pay | Admitting: Physical Therapy

## 2016-03-04 ENCOUNTER — Ambulatory Visit: Payer: Federal, State, Local not specified - PPO | Admitting: Physical Therapy

## 2016-03-04 DIAGNOSIS — M25612 Stiffness of left shoulder, not elsewhere classified: Secondary | ICD-10-CM

## 2016-03-04 DIAGNOSIS — M25512 Pain in left shoulder: Secondary | ICD-10-CM | POA: Diagnosis not present

## 2016-03-04 NOTE — Therapy (Signed)
[facility] [address] [place], [place], [address] Phone: [phone]   Fax:  [phone]  Physical Therapy Treatment  Patient Details  Name: Ariel Ramirez MRN: [id number] Date of Birth: Apr 08, 1960 Referring Provider: Salvatore Marvelobert Wainer MD.  Encounter Date: 03/04/2016      PT End of Session - 03/04/16 1023    Visit Number 9   Number of Visits 16   Date for PT Re-Evaluation 04/09/16   PT Start Time 0943   PT Stop Time 1038   PT Time Calculation (min) 55 min   Activity Tolerance Patient tolerated treatment well   Behavior During Therapy [facility] for tasks assessed/performed      Past Medical History:  Diagnosis Date  . Anxiety   . Arthritis   . Depression   . Hypothyroid     Past Surgical History:  Procedure Laterality Date  . CHOLECYSTECTOMY  11/13/2011   Procedure: LAPAROSCOPIC CHOLECYSTECTOMY;  Surgeon: Dalia HeadingMark A Jenkins, MD;  Location: [facility];  Service: General;  Laterality: N/A;  . ESOPHAGOGASTRODUODENOSCOPY  11/09/2011   Procedure: ESOPHAGOGASTRODUODENOSCOPY (EGD);  Surgeon: Malissa HippoNajeeb U Rehman, MD;  [facility];  Service: Endoscopy;  Laterality: N/A;  730  . PARTIAL HYSTERECTOMY     2011  . TUBAL LIGATION     20 yrs ago    There were no vitals filed for this visit.      Subjective Assessment - 03/04/16 0946    Subjective Patient reported ongoing progress    Patient Stated Goals Use my right arm without pain.   Currently in Pain? Yes   Pain Score 1    Pain Location Shoulder   Pain Orientation Left   Pain Descriptors / Indicators Aching   Pain Type Surgical pain   Pain Onset More than a month ago   Pain Frequency Constant   Aggravating Factors  certain movements   Pain Relieving Factors at rest                         [facility] Treatment/Exercise - 03/04/16 0001      Shoulder Exercises: Prone   Other Prone Exercises kneeling 3# rows and ext 2x10 each     Shoulder Exercises:  Standing   Protraction Strengthening;Left;Theraband  3x10   Theraband Level (Shoulder Protraction) Level 1 (Yellow)   External Rotation Strengthening;Left;Theraband  3x10   Theraband Level (Shoulder External Rotation) Level 1 (Yellow)   Internal Rotation Strengthening;Left;Theraband  3x10   Theraband Level (Shoulder Internal Rotation) Level 1 (Yellow)   Row Strengthening;Left;Theraband  3x10   Other Standing Exercises Pink XTS for scap retraction 2x10   Other Standing Exercises 2# flexion/scaption 2x10 each     Shoulder Exercises: Pulleys   Flexion Other (comment)  5min   Other Pulley Exercises UE ranger for elevation and circles 3x10     Shoulder Exercises: ROM/Strengthening   UBE (Upper Arm Bike) 10min @ 90 RPM     Moist Heat Therapy   Number Minutes Moist Heat 15 Minutes   Moist Heat Location Shoulder     Electrical Stimulation   Electrical Stimulation Location L shoulder premod 80-150 Hz x 15min to tolerance   Electrical Stimulation Goals Pain                     PT Long Term Goals - 02/28/16 1239      PT LONG TERM GOAL #1   Title Independent with a HEP.  Time 8   Period Weeks   Status On-going     PT LONG TERM GOAL #2   Title Active left shoulder flexion to 155 degrees so the patient can easily reach overhead   Baseline 170 degrees in supine.   Time 8   Period Weeks   Status Achieved     PT LONG TERM GOAL #3   Title Active left shoulder ER to 80 degrees+ to allow for easily donning/doffing of apparel   Time 8   Period Weeks   Status On-going     PT LONG TERM GOAL #4   Title Perform ADL's with pain not > 3/10.   Time 8   Period Weeks     PT LONG TERM GOAL #5   Title Increase ROM so patient is able to reach behind back to L3.   Time 8   Period Weeks   Status On-going               Plan - 03/04/16 1024    Clinical Impression Statement Patient tolerated treatment well today with strengthening shoulder. Strengthening per MPT.  Patient had no complaints. Patient has improved ROM overall and little pain complaints. Patient progressing toward goals yet ongoing due to ROM deficts.   Rehab Potential Excellent   PT Frequency 2x / week   PT Duration 8 weeks   PT Treatment/Interventions ADLs/Self Care Home Management;Cryotherapy;Electrical Stimulation;Therapeutic activities;Therapeutic exercise;Neuromuscular re-education;Patient/family education;Passive range of motion;Manual techniques;Vasopneumatic Device   PT Next Visit Plan cont with Aggressive PROM to left shoulder and strengthening per MPT/MD (MD. Thurston HoleWainer 03/12/16)   Consulted and Agree with Plan of Care Patient      Patient will benefit from skilled therapeutic intervention in order to improve the following deficits and impairments:  Pain, Decreased range of motion, Decreased activity tolerance  Visit Diagnosis: Acute pain of left shoulder  Stiffness of left shoulder, not elsewhere classified     Problem List Patient Active Problem List   Diagnosis Date Noted  . Unspecified constipation 12/22/2012  . Bipolar disorder, unspecified 05/25/2012  . Post traumatic stress disorder (PTSD) 05/25/2012  . Arthritis 05/25/2012  . Depression 05/25/2012  . GERD (gastroesophageal reflux disease) 10/26/2011  . Abdominal pain, chronic, right upper quadrant 09/25/2011  . HYPOTHYROIDISM 01/22/2010  . UNSPECIFIED ANEMIA 01/22/2010  . ALOPECIA 01/22/2010  . CRAMP OF LIMB 01/22/2010    Ariel Ramirez, PTA 03/04/2016, 10:39 AM  [facility] [address] [place], [place], [address] Phone: [phone]   Fax:  [phone]  Name: Ariel Ramirez MRN: [id number] Date of Birth: 09-09-1960

## 2016-03-06 ENCOUNTER — Ambulatory Visit: Payer: Federal, State, Local not specified - PPO | Admitting: Physical Therapy

## 2016-03-06 ENCOUNTER — Encounter: Payer: Self-pay | Admitting: Physical Therapy

## 2016-03-06 DIAGNOSIS — M25512 Pain in left shoulder: Secondary | ICD-10-CM

## 2016-03-06 DIAGNOSIS — M25612 Stiffness of left shoulder, not elsewhere classified: Secondary | ICD-10-CM

## 2016-03-06 NOTE — Therapy (Signed)
[facility] [address] [place], [place], [address] Phone: [phone]   Fax:  [phone]  Physical Therapy Treatment  Patient Details  Name: Ariel Ramirez MRN: [id number] Date of Birth: 10/02/1960 Referring Provider: Salvatore Marvelobert Wainer MD.  Encounter Date: 03/06/2016      PT End of Session - 03/06/16 0937    Visit Number 10   Number of Visits 16   Date for PT Re-Evaluation 04/09/16   PT Start Time 0946   PT Stop Time 1039   PT Time Calculation (min) 53 min   Activity Tolerance Patient tolerated treatment well   Behavior During Therapy [facility] for tasks assessed/performed      Past Medical History:  Diagnosis Date  . Anxiety   . Arthritis   . Depression   . Hypothyroid     Past Surgical History:  Procedure Laterality Date  . CHOLECYSTECTOMY  11/13/2011   Procedure: LAPAROSCOPIC CHOLECYSTECTOMY;  Surgeon: Dalia HeadingMark A Jenkins, MD;  Location: [facility];  Service: General;  Laterality: N/A;  . ESOPHAGOGASTRODUODENOSCOPY  11/09/2011   Procedure: ESOPHAGOGASTRODUODENOSCOPY (EGD);  Surgeon: Malissa HippoNajeeb U Rehman, MD;  Location: [facility];  Service: Endoscopy;  Laterality: N/A;  730  . PARTIAL HYSTERECTOMY     2011  . TUBAL LIGATION     20 yrs ago    There were no vitals filed for this visit.      Subjective Assessment - 03/06/16 0937    Subjective Reports she really has been working on her exercises at home.   Patient Stated Goals Use my right arm without pain.   Currently in Pain? Yes   Pain Score 2    Pain Location Shoulder   Pain Orientation Left   Pain Descriptors / Indicators Other (Comment)  "annoying"   Pain Type Surgical pain   Pain Onset More than a month ago            [facility] - 03/06/16 0001      Assessment   Medical Diagnosis Left shoulder manipulation.   Onset Date/Surgical Date 02/12/16     Precautions   Precautions None     Restrictions   Weight Bearing Restrictions No                      [facility] Adult PT Treatment/Exercise - 03/06/16 0001      Shoulder Exercises: Standing   Protraction Strengthening;Left;Theraband   Theraband Level (Shoulder Protraction) Level 1 (Yellow)   Protraction Limitations 3x10 reps   External Rotation Strengthening;Left;Theraband  3x10 reps   Theraband Level (Shoulder External Rotation) Level 1 (Yellow)   Internal Rotation Strengthening;Left;Theraband  3x10 reps   Theraband Level (Shoulder Internal Rotation) Level 1 (Yellow)   Flexion Strengthening;Both;20 reps;Weights   Shoulder Flexion Weight (lbs) 2   Extension Strengthening;Left;Theraband   Theraband Level (Shoulder Extension) Level 1 (Yellow)   Extension Limitations 3x10 reps   Row Strengthening;Left;Theraband   Theraband Level (Shoulder Row) Level 1 (Yellow)   Row Limitations 3x10 reps   Other Standing Exercises Bent row and extension 3# x20 reps each   Other Standing Exercises B shoulder scaption 2# x20 reps     Shoulder Exercises: Pulleys   Flexion Other (comment)  x5 min   Other Pulley Exercises UE ranger for elevation and circles 3x10     Shoulder Exercises: ROM/Strengthening   UBE (Upper Arm Bike) 90 RPM x 8 min     Shoulder Exercises: Stretch   Internal Rotation Stretch 3 reps  30 sec hold     Modalities   Modalities Electrical Stimulation;Moist Heat     Moist Heat Therapy   Number Minutes Moist Heat 15 Minutes   Moist Heat Location Shoulder     Electrical Stimulation   Electrical Stimulation Location L shoulder    Electrical Stimulation Action Pre-Mod   Electrical Stimulation Parameters 80-150 hz x15 min   Electrical Stimulation Goals Pain                     PT Long Term Goals - 03/06/16 0959      PT LONG TERM GOAL #1   Title Independent with a HEP.   Time 8   Period Weeks   Status Achieved     PT LONG TERM GOAL #2   Title Active left shoulder flexion to 155 degrees so the patient can easily reach overhead    Baseline 170 degrees in supine.   Time 8   Period Weeks   Status Achieved     PT LONG TERM GOAL #3   Title Active left shoulder ER to 80 degrees+ to allow for easily donning/doffing of apparel   Time 8   Period Weeks   Status On-going     PT LONG TERM GOAL #4   Title Perform ADL's with pain not > 3/10.   Time 8   Period Weeks   Status Achieved     PT LONG TERM GOAL #5   Title Increase ROM so patient is able to reach behind back to L3.   Time 8   Period Weeks   Status On-going               Plan - 03/06/16 1119    Clinical Impression Statement Patient tolerated today's treatment fairly well with only facial grimacing of discomfort with intermittant ROM exercises. Patient able to complete theraband and handweights without complaint and with good technique following minimal multimodal cueing for technique. Patient able to achieve goal regarding pain with ADLs. Normal modalities response noted following removal of the modalities.   Rehab Potential Excellent   PT Frequency 2x / week   PT Duration 8 weeks   PT Treatment/Interventions ADLs/Self Care Home Management;Cryotherapy;Electrical Stimulation;Therapeutic activities;Therapeutic exercise;Neuromuscular re-education;Patient/family education;Passive range of motion;Manual techniques;Vasopneumatic Device   PT Next Visit Plan cont with Aggressive PROM to left shoulder and strengthening per MPT/MD (MD. Thurston HoleWainer 03/12/16)   PT Home Exercise Plan HEP: supine cane IR/ER/flex; thoracic ext; scapular retraction   Consulted and Agree with Plan of Care Patient      Patient will benefit from skilled therapeutic intervention in order to improve the following deficits and impairments:  Pain, Decreased range of motion, Decreased activity tolerance  Visit Diagnosis: Acute pain of left shoulder  Stiffness of left shoulder, not elsewhere classified     Problem List Patient Active Problem List   Diagnosis Date Noted  . Unspecified  constipation 12/22/2012  . Bipolar disorder, unspecified 05/25/2012  . Post traumatic stress disorder (PTSD) 05/25/2012  . Arthritis 05/25/2012  . Depression 05/25/2012  . GERD (gastroesophageal reflux disease) 10/26/2011  . Abdominal pain, chronic, right upper quadrant 09/25/2011  . HYPOTHYROIDISM 01/22/2010  . UNSPECIFIED ANEMIA 01/22/2010  . ALOPECIA 01/22/2010  . CRAMP OF LIMB 01/22/2010    Evelene CroonKelsey M Parsons, PTA 03/06/2016, 11:22 AM  [facility] [address] [place], [place], [address] Phone: [phone]   Fax:  [phone]  Name: Ariel Matarngela C Aymond MRN: [id number] Date of Birth: [birth year]

## 2016-03-11 ENCOUNTER — Ambulatory Visit: Payer: Federal, State, Local not specified - PPO | Admitting: Physical Therapy

## 2016-03-11 ENCOUNTER — Encounter: Payer: Self-pay | Admitting: Physical Therapy

## 2016-03-11 DIAGNOSIS — M25512 Pain in left shoulder: Secondary | ICD-10-CM

## 2016-03-11 DIAGNOSIS — M25612 Stiffness of left shoulder, not elsewhere classified: Secondary | ICD-10-CM

## 2016-03-11 NOTE — Therapy (Signed)
[facility] [address] [place], [place], [address] Phone: [phone]   Fax:  [phone]  Physical Therapy Treatment  Patient Details  Name: Ariel Ramirez MRN: [id number] Date of Birth: 16-Oct-1960 Referring Provider: Salvatore Marvelobert Wainer MD.  Encounter Date: 03/11/2016      PT End of Session - 03/11/16 1036    Visit Number 11   Number of Visits 16   Date for PT Re-Evaluation 04/09/16   PT Start Time 0944   PT Stop Time 1044   PT Time Calculation (min) 60 min   Activity Tolerance Patient tolerated treatment well   Behavior During Therapy [facility] for tasks assessed/performed      Past Medical History:  Diagnosis Date  . Anxiety   . Arthritis   . Depression   . Hypothyroid     Past Surgical History:  Procedure Laterality Date  . CHOLECYSTECTOMY  11/13/2011   Procedure: LAPAROSCOPIC CHOLECYSTECTOMY;  Surgeon: Dalia HeadingMark A Jenkins, MD;  Location: [facility];  Service: General;  Laterality: N/A;  . ESOPHAGOGASTRODUODENOSCOPY  11/09/2011   Procedure: ESOPHAGOGASTRODUODENOSCOPY (EGD);  Surgeon: Malissa HippoNajeeb U Rehman, MD;  [facility];  Service: Endoscopy;  Laterality: N/A;  730  . PARTIAL HYSTERECTOMY     2011  . TUBAL LIGATION     20 yrs ago    There were no vitals filed for this visit.      Subjective Assessment - 03/11/16 0948    Subjective Patient reported ongoing soreness yet progress overall   Patient Stated Goals Use my right arm without pain.   Currently in Pain? Yes   Pain Score 2    Pain Location Shoulder   Pain Orientation Left   Pain Descriptors / Indicators Aching   Pain Type Surgical pain   Pain Onset More than a month ago   Pain Frequency Intermittent   Aggravating Factors  certain movements   Pain Relieving Factors at rest            [facility] Assessment - 03/11/16 0001      AROM   AROM Assessment Site Shoulder   Right/Left Shoulder Left   Left Shoulder Flexion 155 Degrees   Left Shoulder Internal  Rotation --  L4   Left Shoulder External Rotation 65 Degrees     PROM   PROM Assessment Site Shoulder   Left Shoulder External Rotation 73 Degrees                     [facility] Adult PT Treatment/Exercise - 03/11/16 0001      Shoulder Exercises: Standing   Protraction Strengthening;Left;Theraband  3x10   Theraband Level (Shoulder Protraction) Level 1 (Yellow)   External Rotation Strengthening;Left;Theraband  3x10   Theraband Level (Shoulder External Rotation) Level 1 (Yellow)   Internal Rotation Strengthening;Left;Theraband  3x10   Theraband Level (Shoulder Internal Rotation) Level 1 (Yellow)   Row Strengthening;Left;Theraband  3x10   Theraband Level (Shoulder Row) Level 1 (Yellow)   Other Standing Exercises Bent row and extension 3# x20 reps each   Other Standing Exercises B shoulder scaption 2# x20 reps     Shoulder Exercises: Pulleys   Flexion Other (comment)  5min   Other Pulley Exercises UE ranger for elevation and circles 3x10     Shoulder Exercises: ROM/Strengthening   UBE (Upper Arm Bike) 90 RPM x 8 min     Moist Heat Therapy   Number Minutes Moist Heat 15 Minutes   Moist Heat Location Shoulder  Programme researcher, broadcasting/film/videolectrical Stimulation   Electrical Stimulation Location L shoulder    Electrical Stimulation Action premod   Electrical Stimulation Parameters 80-150hz  x715min   Electrical Stimulation Goals Pain                     PT Long Term Goals - 03/11/16 1037      PT LONG TERM GOAL #1   Title Independent with a HEP.   Time 8   Period Weeks   Status Achieved     PT LONG TERM GOAL #2   Title Active left shoulder flexion to 155 degrees so the patient can easily reach overhead   Baseline 170 degrees in supine.   Time 8   Period Weeks   Status Achieved     PT LONG TERM GOAL #3   Title Active left shoulder ER to 80 degrees+ to allow for easily donning/doffing of apparel   Time 8   Period Weeks   Status On-going  AROM 65 degrees 03/11/16      PT LONG TERM GOAL #4   Title Perform ADL's with pain not > 3/10.   Time 8   Period Weeks   Status Achieved     PT LONG TERM GOAL #5   Title Increase ROM so patient is able to reach behind back to L3.   Time 8   Period Weeks   Status On-going  AROM L4 03/11/16               Plan - 03/11/16 1039    Clinical Impression Statement Patient progressing with all activities and tolerated treatment well today. Patient has improved with ROM overall with all motions, and has reported improvement overall with overhead reaching. Patient has ongoing ache with certain movements. Patient progressing toward goals yet remaining goals ongoing due to full active ROM deficts.    Rehab Potential Excellent   PT Frequency 2x / week   PT Duration 8 weeks   PT Treatment/Interventions ADLs/Self Care Home Management;Cryotherapy;Electrical Stimulation;Therapeutic activities;Therapeutic exercise;Neuromuscular re-education;Patient/family education;Passive range of motion;Manual techniques;Vasopneumatic Device   PT Next Visit Plan cont with POC per MD (MD. Thurston HoleWainer 03/12/16)   Consulted and Agree with Plan of Care Patient      Patient will benefit from skilled therapeutic intervention in order to improve the following deficits and impairments:  Pain, Decreased range of motion, Decreased activity tolerance  Visit Diagnosis: Acute pain of left shoulder  Stiffness of left shoulder, not elsewhere classified     Problem List Patient Active Problem List   Diagnosis Date Noted  . Unspecified constipation 12/22/2012  . Bipolar disorder, unspecified 05/25/2012  . Post traumatic stress disorder (PTSD) 05/25/2012  . Arthritis 05/25/2012  . Depression 05/25/2012  . GERD (gastroesophageal reflux disease) 10/26/2011  . Abdominal pain, chronic, right upper quadrant 09/25/2011  . HYPOTHYROIDISM 01/22/2010  . UNSPECIFIED ANEMIA 01/22/2010  . ALOPECIA 01/22/2010  . CRAMP OF LIMB 01/22/2010     Cathie HoopsChristina  Jennet Scroggin, PTA 03/11/16 10:56 AM [place] MPT [facility] [address] [place], [place], [address] Phone: [phone]   Fax:  [phone]  Name: Ariel Ramirez MRN: [id number] Date of Birth: 1960-07-15

## 2016-03-12 ENCOUNTER — Ambulatory Visit: Payer: Federal, State, Local not specified - PPO | Admitting: Physical Therapy

## 2016-03-12 ENCOUNTER — Encounter: Payer: Self-pay | Admitting: Physical Therapy

## 2016-03-12 DIAGNOSIS — M25512 Pain in left shoulder: Secondary | ICD-10-CM

## 2016-03-12 DIAGNOSIS — M25612 Stiffness of left shoulder, not elsewhere classified: Secondary | ICD-10-CM

## 2016-03-12 NOTE — Therapy (Signed)
[facility] [address] [place], [place], [address] Phone: [phone]   Fax:  [phone]  Physical Therapy Treatment  Patient Details  Name: Ariel Ramirez MRN: [id number] Date of Birth: [birth year] Referring Provider: Salvatore Marvelobert Wainer MD.  Encounter Date: 03/12/2016      PT End of Session - 03/12/16 1504    Visit Number 12   Number of Visits 16   Date for PT Re-Evaluation 04/09/16   PT Start Time 1446   PT Stop Time 1530   PT Time Calculation (min) 44 min   Activity Tolerance Patient tolerated treatment well   Behavior During Therapy [facility] for tasks assessed/performed      Past Medical History:  Diagnosis Date  . Anxiety   . Arthritis   . Depression   . Hypothyroid     Past Surgical History:  Procedure Laterality Date  . CHOLECYSTECTOMY  11/13/2011   Procedure: LAPAROSCOPIC CHOLECYSTECTOMY;  Surgeon: Dalia HeadingMark A Jenkins, MD;  Location: [facility];  Service: General;  Laterality: N/A;  . ESOPHAGOGASTRODUODENOSCOPY  11/09/2011   Procedure: ESOPHAGOGASTRODUODENOSCOPY (EGD);  Surgeon: Malissa HippoNajeeb U Rehman, MD;  [facility];  Service: Endoscopy;  Laterality: N/A;  730  . PARTIAL HYSTERECTOMY     2011  . TUBAL LIGATION     20 yrs ago    There were no vitals filed for this visit.      Subjective Assessment - 03/12/16 1454    Subjective Patient went to MD and is to continue with therapy and has new order for DN   Patient is accompained by: Family member   Patient Stated Goals Use my right arm without pain.   Currently in Pain? Yes   Pain Score 2    Pain Location Shoulder   Pain Orientation Left   Pain Descriptors / Indicators Aching   Pain Type Surgical pain   Pain Onset More than a month ago   Pain Frequency Intermittent   Aggravating Factors  certain movements   Pain Relieving Factors at rest                         [facility] Adult PT Treatment/Exercise - 03/12/16 0001      Shoulder Exercises:  Standing   Protraction Strengthening;Left;Theraband  3x10   Theraband Level (Shoulder Protraction) Level 1 (Yellow)   External Rotation Strengthening;Left;Theraband  3x10   Theraband Level (Shoulder External Rotation) Level 1 (Yellow)   Internal Rotation Strengthening;Left;Theraband  3x10   Theraband Level (Shoulder Internal Rotation) Level 1 (Yellow)   Flexion Strengthening;Both;20 reps;Weights   Shoulder Flexion Weight (lbs) 2   Row Strengthening;Left;Theraband  3x10   Theraband Level (Shoulder Row) Level 1 (Yellow)   Other Standing Exercises Bent row and extension 3# x20 reps each   Other Standing Exercises B shoulder scaption 2# x20 reps     Shoulder Exercises: ROM/Strengthening   UBE (Upper Arm Bike) 90 RPM x 10 min     Moist Heat Therapy   Number Minutes Moist Heat 15 Minutes   Moist Heat Location Shoulder     Electrical Stimulation   Electrical Stimulation Location L shoulder    Electrical Stimulation Action pemod   Electrical Stimulation Parameters 80-150hz  x7915min   Electrical Stimulation Goals Pain                     PT Long Term Goals - 03/11/16 1037      PT LONG TERM GOAL #1  Title Independent with a HEP.   Time 8   Period Weeks   Status Achieved     PT LONG TERM GOAL #2   Title Active left shoulder flexion to 155 degrees so the patient can easily reach overhead   Baseline 170 degrees in supine.   Time 8   Period Weeks   Status Achieved     PT LONG TERM GOAL #3   Title Active left shoulder ER to 80 degrees+ to allow for easily donning/doffing of apparel   Time 8   Period Weeks   Status On-going  AROM 65 degrees 03/11/16     PT LONG TERM GOAL #4   Title Perform ADL's with pain not > 3/10.   Time 8   Period Weeks   Status Achieved     PT LONG TERM GOAL #5   Title Increase ROM so patient is able to reach behind back to L3.   Time 8   Period Weeks   Status On-going  AROM L4 03/11/16               Plan - 03/12/16 1507     Clinical Impression Statement Patient continues to progress with all activities. Patient tolerated treatment well today with no pain reported. Patient continues to have ongoing soreness with certain movements or ADL's. Patient current goals ongoing due to strength pain and ROM deficits.    Rehab Potential Excellent   PT Frequency 2x / week   PT Duration 8 weeks   PT Treatment/Interventions ADLs/Self Care Home Management;Cryotherapy;Electrical Stimulation;Therapeutic activities;Therapeutic exercise;Neuromuscular re-education;Patient/family education;Passive range of motion;Manual techniques;Vasopneumatic Device   PT Next Visit Plan cont with POC per MD new order (patient to bring next treatment)   Consulted and Agree with Plan of Care Patient      Patient will benefit from skilled therapeutic intervention in order to improve the following deficits and impairments:  Pain, Decreased range of motion, Decreased activity tolerance  Visit Diagnosis: Acute pain of left shoulder  Stiffness of left shoulder, not elsewhere classified     Problem List Patient Active Problem List   Diagnosis Date Noted  . Unspecified constipation 12/22/2012  . Bipolar disorder, unspecified 05/25/2012  . Post traumatic stress disorder (PTSD) 05/25/2012  . Arthritis 05/25/2012  . Depression 05/25/2012  . GERD (gastroesophageal reflux disease) 10/26/2011  . Abdominal pain, chronic, right upper quadrant 09/25/2011  . HYPOTHYROIDISM 01/22/2010  . UNSPECIFIED ANEMIA 01/22/2010  . ALOPECIA 01/22/2010  . CRAMP OF LIMB 01/22/2010    Cortlin Marano P, PTA 03/12/2016, 3:30 PM  [facility] [address] [place], [place], [address] Phone: [phone]   Fax:  [phone]  Name: Ariel Ramirez MRN: [id number] Date of Birth: 06-20-1960

## 2016-03-13 ENCOUNTER — Ambulatory Visit: Payer: Federal, State, Local not specified - PPO | Admitting: *Deleted

## 2016-03-13 DIAGNOSIS — M25512 Pain in left shoulder: Secondary | ICD-10-CM | POA: Diagnosis not present

## 2016-03-13 DIAGNOSIS — M25612 Stiffness of left shoulder, not elsewhere classified: Secondary | ICD-10-CM

## 2016-03-13 NOTE — Therapy (Signed)
[facility] [address] [place], [place], [address] Phone: [phone]   Fax:  [phone]  Physical Therapy Treatment  Patient Details  Name: Ariel Ramirez MRN: [id number] Date of Birth: 08-Apr-1960 Referring Provider: Salvatore Marvelobert Wainer MD.  Encounter Date: 03/13/2016      PT End of Session - 03/13/16 1004    Visit Number 13   Number of Visits 16   Date for PT Re-Evaluation 04/09/16   PT Start Time 0945   PT Stop Time 1045   PT Time Calculation (min) 60 min      Past Medical History:  Diagnosis Date  . Anxiety   . Arthritis   . Depression   . Hypothyroid     Past Surgical History:  Procedure Laterality Date  . CHOLECYSTECTOMY  11/13/2011   Procedure: LAPAROSCOPIC CHOLECYSTECTOMY;  Surgeon: Dalia HeadingMark A Jenkins, MD;  Location: [facility];  Service: General;  Laterality: N/A;  . ESOPHAGOGASTRODUODENOSCOPY  11/09/2011   Procedure: ESOPHAGOGASTRODUODENOSCOPY (EGD);  Surgeon: Malissa HippoNajeeb U Rehman, MD;  [facility];  Service: Endoscopy;  Laterality: N/A;  730  . PARTIAL HYSTERECTOMY     2011  . TUBAL LIGATION     20 yrs ago    There were no vitals filed for this visit.      Subjective Assessment - 03/13/16 1005    Subjective Patient went to MD and is to continue with therapy and has new order for DN   Patient Stated Goals Use my right arm without pain.   Currently in Pain? Yes   Pain Score 2    Pain Orientation Left   Pain Descriptors / Indicators Aching   Pain Type Surgical pain   Pain Onset More than a month ago                         [facility] Adult PT Treatment/Exercise - 03/13/16 0001      Shoulder Exercises: Standing   Row --  3x10   Other Standing Exercises Bent row and extension 3# x20 reps each   Other Standing Exercises B shoulder scaption 2# x20 reps     Shoulder Exercises: Pulleys   Flexion Other (comment)  5min   Other Pulley Exercises UE ranger for elevation and circles 3x10     Shoulder Exercises: ROM/Strengthening   UBE (Upper Arm Bike) 90 RPM x 10 min     Moist Heat Therapy   Number Minutes Moist Heat 15 Minutes   Moist Heat Location Shoulder     Electrical Stimulation   Electrical Stimulation Location L shldr IFC x 15 mins   Electrical Stimulation Goals Pain     Manual Therapy   Manual Therapy Passive ROM   Manual therapy comments In supine:  Left shoulder PROM into flexion and ER in the plane of the scapula                      PT Long Term Goals - 03/11/16 1037      PT LONG TERM GOAL #1   Title Independent with a HEP.   Time 8   Period Weeks   Status Achieved     PT LONG TERM GOAL #2   Title Active left shoulder flexion to 155 degrees so the patient can easily reach overhead   Baseline 170 degrees in supine.   Time 8   Period Weeks   Status Achieved     PT LONG TERM GOAL #  3   Title Active left shoulder ER to 80 degrees+ to allow for easily donning/doffing of apparel   Time 8   Period Weeks   Status On-going  AROM 65 degrees 03/11/16     PT LONG TERM GOAL #4   Title Perform ADL's with pain not > 3/10.   Time 8   Period Weeks   Status Achieved     PT LONG TERM GOAL #5   Title Increase ROM so patient is able to reach behind back to L3.   Time 8   Period Weeks   Status On-going  AROM L4 03/11/16               Plan - 03/12/16 1507    Clinical Impression Statement Patient continues to progress with all activities. Patient tolerated treatment well today with no pain reported. Patient continues to have ongoing soreness with certain movements or ADL's. Patient current goals ongoing due to strength pain and ROM deficits.    Rehab Potential Excellent   PT Frequency 2x / week   PT Duration 8 weeks   PT Treatment/Interventions ADLs/Self Care Home Management;Cryotherapy;Electrical Stimulation;Therapeutic activities;Therapeutic exercise;Neuromuscular re-education;Patient/family education;Passive range of motion;Manual  techniques;Vasopneumatic Device   PT Next Visit Plan cont with POC per MD new order (patient to bring next treatment)   Consulted and Agree with Plan of Care Patient      Patient will benefit from skilled therapeutic intervention in order to improve the following deficits and impairments:     Visit Diagnosis: Acute pain of left shoulder  Stiffness of left shoulder, not elsewhere classified     Problem List Patient Active Problem List   Diagnosis Date Noted  . Unspecified constipation 12/22/2012  . Bipolar disorder, unspecified 05/25/2012  . Post traumatic stress disorder (PTSD) 05/25/2012  . Arthritis 05/25/2012  . Depression 05/25/2012  . GERD (gastroesophageal reflux disease) 10/26/2011  . Abdominal pain, chronic, right upper quadrant 09/25/2011  . HYPOTHYROIDISM 01/22/2010  . UNSPECIFIED ANEMIA 01/22/2010  . ALOPECIA 01/22/2010  . CRAMP OF LIMB 01/22/2010    Dakia Schifano,CHRIS, PTA 03/13/2016, 1:22 PM  [facility] [address] [place], [place], [address] Phone: [phone]   Fax:  [phone]  Name: Ariel Ramirez MRN: [id number] Date of Birth: 08/01/1960

## 2016-03-18 ENCOUNTER — Ambulatory Visit: Payer: Federal, State, Local not specified - PPO | Attending: Orthopedic Surgery | Admitting: Physical Therapy

## 2016-03-18 DIAGNOSIS — M25512 Pain in left shoulder: Secondary | ICD-10-CM | POA: Insufficient documentation

## 2016-03-18 DIAGNOSIS — M25612 Stiffness of left shoulder, not elsewhere classified: Secondary | ICD-10-CM

## 2016-03-18 NOTE — Patient Instructions (Signed)
Trigger Point Dry Needling  . What is Trigger Point Dry Needling (DN)? o DN is a physical therapy technique used to treat muscle pain and dysfunction. Specifically, DN helps deactivate muscle trigger points (muscle knots).  o A thin filiform needle is used to penetrate the skin and stimulate the underlying trigger point. The goal is for a local twitch response (LTR) to occur and for the trigger point to relax. No medication of any kind is injected during the procedure.   . What Does Trigger Point Dry Needling Feel Like?  o The procedure feels different for each individual patient. Some patients report that they do not actually feel the needle enter the skin and overall the process is not painful. Very mild bleeding may occur. However, many patients feel a deep cramping in the muscle in which the needle was inserted. This is the local twitch response.   Marland Kitchen. How Will I feel after the treatment? o Soreness is normal, and the onset of soreness may not occur for a few hours. Typically this soreness does not last longer than two days.  o Bruising is uncommon, however; ice can be used to decrease any possible bruising.  o In rare cases feeling tired or nauseous after the treatment is normal. In addition, your symptoms may get worse before they get better, this period will typically not last longer than 24 hours.   . What Can I do After My Treatment? o Increase your hydration by drinking more water for the next 24 hours. o You may place ice or heat on the areas treated that have become sore, however, do not use heat on inflamed or bruised areas. Heat often brings more relief post needling. o You can continue your regular activities, but vigorous activity is not recommended initially after the treatment for 24 hours. o DN is best combined with other physical therapy such as strengthening, stretching, and other therapies.    Precautions:  In some cases, dry needling is done over the lung field. While rare,  there is a risk of pneumothorax (punctured lung). Because of this, if you ever experience shortness of breath on exertion, difficulty taking a deep breath, chest pain or a dry cough following dry needling, you should report to an emergency room and tell them that you have been dry needled over the thorax.   Ariel Ramirez, PT 03/18/16 10:00 AM [facility] [address] [place], [place], [address] Phone: [phone]   Fax:  [phone]

## 2016-03-18 NOTE — Therapy (Signed)
[facility] [address] [place], [place], [address] Phone: [phone]   Fax:  [phone]  Physical Therapy Treatment  Patient Details  Name: Ariel Ramirez MRN: [id number] Date of Birth: 1960/08/20 Referring Provider: Salvatore Marvel MD.  Encounter Date: 03/18/2016      PT End of Session - 03/18/16 0947    Visit Number 14   Number of Visits 16   Date for PT Re-Evaluation 04/09/16   PT Start Time 0946   PT Stop Time 1048   PT Time Calculation (min) 62 min   Activity Tolerance Patient tolerated treatment well   Behavior During Therapy [facility] for tasks assessed/performed      Past Medical History:  Diagnosis Date  . Anxiety   . Arthritis   . Depression   . Hypothyroid     Past Surgical History:  Procedure Laterality Date  . CHOLECYSTECTOMY  11/13/2011   Procedure: LAPAROSCOPIC CHOLECYSTECTOMY;  Surgeon: Dalia Heading, MD;  Location: [facility];  Service: General;  Laterality: N/A;  . ESOPHAGOGASTRODUODENOSCOPY  11/09/2011   Procedure: ESOPHAGOGASTRODUODENOSCOPY (EGD);  Surgeon: Malissa Hippo, MD;  Location: [facility];  Service: Endoscopy;  Laterality: N/A;  730  . PARTIAL HYSTERECTOMY     2011  . TUBAL LIGATION     20 yrs ago    There were no vitals filed for this visit.      Subjective Assessment - 03/18/16 0947    Subjective Patient reports her pain is starting to keep her up at night. She is doing pretty well with motion.   Patient Stated Goals Use my right arm without pain.   Currently in Pain? Yes   Pain Score 1   3/10 at night   Pain Location Shoulder   Pain Orientation Left   Pain Descriptors / Indicators Aching   Pain Type Surgical pain   Pain Onset More than a month ago   Pain Frequency Intermittent   Aggravating Factors  lying on her side   Pain Relieving Factors rest            [facility] PT Assessment - 03/18/16 0001      AROM   AROM Assessment Site Shoulder   Right/Left Shoulder Left   Left  Shoulder Flexion 165 Degrees   Left Shoulder External Rotation 67 Degrees                     [facility] Adult PT Treatment/Exercise - 03/18/16 0001      Shoulder Exercises: Supine   Protraction Strengthening;Left;10 reps     Shoulder Exercises: Pulleys   Flexion Other (comment)    Other Pulley Exercises --  performed pnf pattern without difficulty     Shoulder Exercises: ROM/Strengthening   Other ROM/Strengthening Exercises IR with strap     Modalities   Modalities Electrical Stimulation;Moist Heat     Moist Heat Therapy   Number Minutes Moist Heat 15 Minutes   Moist Heat Location Shoulder     Electrical Stimulation   Electrical Stimulation Location L shldr IFC x 15 mins   Electrical Stimulation Goals Pain     Manual Therapy   Manual Therapy Scapular mobilization;Soft tissue mobilization   Soft tissue mobilization to L UT, levator scapula, pecs, subscapularis and teres major/lats   Scapular Mobilization in [facility] for protraction          Trigger Point Dry Needling - 03/18/16 1151    Consent Given? Yes   Education Handout Provided Yes  Muscles Treated Upper Body Upper trapezius;Pectoralis major;Pectoralis minor;Levator scapulae;Subscapularis  L   Upper Trapezius Response Twitch reponse elicited;Palpable increased muscle length   Pectoralis Major Response Twitch response elicited;Palpable increased muscle length   Pectoralis Minor Response Twitch response elicited;Palpable increased muscle length   Levator Scapulae Response Twitch response elicited;Palpable increased muscle length   Subscapularis Response Twitch response elicited;Palpable increased muscle length              PT Education - 03/18/16 1152    Education provided Yes   Education Details HEP; DN education and aftercare   Person(s) Educated Patient   Methods Explanation;Demonstration;Handout   Comprehension Verbalized understanding;Returned demonstration             PT Long  Term Goals - 03/11/16 1037      PT LONG TERM GOAL #1   Title Independent with a HEP.   Time 8   Period Weeks   Status Achieved     PT LONG TERM GOAL #2   Title Active left shoulder flexion to 155 degrees so the patient can easily reach overhead   Baseline 170 degrees in supine.   Time 8   Period Weeks   Status Achieved     PT LONG TERM GOAL #3   Title Active left shoulder ER to 80 degrees+ to allow for easily donning/doffing of apparel   Time 8   Period Weeks   Status On-going  AROM 65 degrees 03/11/16     PT LONG TERM GOAL #4   Title Perform ADL's with pain not > 3/10.   Time 8   Period Weeks   Status Achieved     PT LONG TERM GOAL #5   Title Increase ROM so patient is able to reach behind back to L3.   Time 8   Period Weeks   Status On-going  AROM L4 03/11/16               Plan - 03/18/16 1153    Clinical Impression Statement Patient tolerated DN well with ++LTR in L subscapularis and levator scapula and with increased ROM demonstrated at end of treatment. She continues to demo decreased scapular protraction which is likely contributing to her limited ROM. She is progressing with shoulder flexion and ER, but is still very limited with IR.   Rehab Potential Excellent   PT Frequency 2x / week   PT Duration 8 weeks   PT Treatment/Interventions ADLs/Self Care Home Management;Cryotherapy;Electrical Stimulation;Therapeutic activities;Therapeutic exercise;Neuromuscular re-education;Patient/family education;Passive range of motion;Manual techniques;Vasopneumatic Device   PT Next Visit Plan Assess DN; continue with scapular mobs and subscap and pectorals release.    PT Home Exercise Plan HEP: serratus punch supine; supine cane IR/ER/flex; thoracic ext; scapular retraction   Consulted and Agree with Plan of Care Patient      Patient will benefit from skilled therapeutic intervention in order to improve the following deficits and impairments:  Pain, Decreased range of  motion, Decreased activity tolerance  Visit Diagnosis: Stiffness of left shoulder, not elsewhere classified  Acute pain of left shoulder     Problem List Patient Active Problem List   Diagnosis Date Noted  . Unspecified constipation 12/22/2012  . Bipolar disorder, unspecified 05/25/2012  . Post traumatic stress disorder (PTSD) 05/25/2012  . Arthritis 05/25/2012  . Depression 05/25/2012  . GERD (gastroesophageal reflux disease) 10/26/2011  . Abdominal pain, chronic, right upper quadrant 09/25/2011  . HYPOTHYROIDISM 01/22/2010  . UNSPECIFIED ANEMIA 01/22/2010  . ALOPECIA 01/22/2010  . CRAMP OF LIMB  01/22/2010   Solon Palm PT 03/18/2016, 12:02 PM  [facility] [address] [place], [place], [address] Phone: [phone]   Fax:  [phone]  Name: Ariel Ramirez MRN: [id number] Date of Birth: Jun 18, 1960

## 2016-03-19 ENCOUNTER — Ambulatory Visit: Payer: Federal, State, Local not specified - PPO | Admitting: Physical Therapy

## 2016-03-19 ENCOUNTER — Encounter: Payer: Self-pay | Admitting: Physical Therapy

## 2016-03-19 DIAGNOSIS — M25512 Pain in left shoulder: Secondary | ICD-10-CM

## 2016-03-19 DIAGNOSIS — M25612 Stiffness of left shoulder, not elsewhere classified: Secondary | ICD-10-CM | POA: Diagnosis not present

## 2016-03-19 NOTE — Therapy (Signed)
[facility] [address] [place], [place], [address] Phone: [phone]   Fax:  [phone]  Physical Therapy Treatment  Patient Details  Name: Ariel Ramirez MRN: [id number] Date of Birth: 1960-11-17 Referring Provider: Salvatore Marvelobert Wainer MD.  Encounter Date: 03/19/2016      PT End of Session - 03/19/16 1422    Visit Number 15   Number of Visits 16   Date for PT Re-Evaluation 04/09/16   PT Start Time 1402   PT Stop Time 1444   PT Time Calculation (min) 42 min   Activity Tolerance Patient tolerated treatment well   Behavior During Therapy [facility] for tasks assessed/performed      Past Medical History:  Diagnosis Date  . Anxiety   . Arthritis   . Depression   . Hypothyroid     Past Surgical History:  Procedure Laterality Date  . CHOLECYSTECTOMY  11/13/2011   Procedure: LAPAROSCOPIC CHOLECYSTECTOMY;  Surgeon: Dalia HeadingMark A Jenkins, MD;  Location: [facility];  Service: General;  Laterality: N/A;  . ESOPHAGOGASTRODUODENOSCOPY  11/09/2011   Procedure: ESOPHAGOGASTRODUODENOSCOPY (EGD);  Surgeon: Malissa HippoNajeeb U Rehman, MD;  Location: [facility];  Service: Endoscopy;  Laterality: N/A;  730  . PARTIAL HYSTERECTOMY     2011  . TUBAL LIGATION     20 yrs ago    There were no vitals filed for this visit.      Subjective Assessment - 03/19/16 1411    Subjective Patient reported feeling a goos response from dry needling and was able to sleep without pain   Patient Stated Goals Use my right arm without pain.   Currently in Pain? Yes   Pain Score 1    Pain Location Shoulder   Pain Orientation Left   Pain Descriptors / Indicators Aching   Pain Type Surgical pain   Pain Onset More than a month ago   Pain Frequency Intermittent   Aggravating Factors  laying on her side   Pain Relieving Factors rest                         [facility] Adult PT Treatment/Exercise - 03/19/16 0001      Shoulder Exercises: Standing   Flexion  Strengthening;Both;Weights  3x10   Shoulder Flexion Weight (lbs) 2   Other Standing Exercises Bent row and extension 3# x20 reps each     Shoulder Exercises: Pulleys   Flexion Other (comment)  5min     Shoulder Exercises: ROM/Strengthening   Cybex Press 2 plate  [id number]   Cybex Row 1 plate  [id number]   Other ROM/Strengthening Exercises lat pull 2plt 3x10     Moist Heat Therapy   Number Minutes Moist Heat 15 Minutes   Moist Heat Location Shoulder     Electrical Stimulation   Electrical Stimulation Location L shldr IFC x 15 mins   Electrical Stimulation Goals Pain          Trigger Point Dry Needling - 03/18/16 1151    Consent Given? Yes   Education Handout Provided Yes   Muscles Treated Upper Body Upper trapezius;Pectoralis major;Pectoralis minor;Levator scapulae;Subscapularis  L   Upper Trapezius Response Twitch reponse elicited;Palpable increased muscle length   Pectoralis Major Response Twitch response elicited;Palpable increased muscle length   Pectoralis Minor Response Twitch response elicited;Palpable increased muscle length   Levator Scapulae Response Twitch response elicited;Palpable increased muscle length   Subscapularis Response Twitch response elicited;Palpable increased muscle length  PT Education - 03/18/16 1152    Education provided Yes   Education Details HEP; DN education and aftercare   Person(s) Educated Patient   Methods Explanation;Demonstration;Handout   Comprehension Verbalized understanding;Returned demonstration             PT Long Term Goals - 03/11/16 1037      PT LONG TERM GOAL #1   Title Independent with a HEP.   Time 8   Period Weeks   Status Achieved     PT LONG TERM GOAL #2   Title Active left shoulder flexion to 155 degrees so the patient can easily reach overhead   Baseline 170 degrees in supine.   Time 8   Period Weeks   Status Achieved     PT LONG TERM GOAL #3   Title Active left shoulder ER to 80  degrees+ to allow for easily donning/doffing of apparel   Time 8   Period Weeks   Status On-going  AROM 65 degrees 03/11/16     PT LONG TERM GOAL #4   Title Perform ADL's with pain not > 3/10.   Time 8   Period Weeks   Status Achieved     PT LONG TERM GOAL #5   Title Increase ROM so patient is able to reach behind back to L3.   Time 8   Period Weeks   Status On-going  AROM L4 03/11/16               Plan - 03/19/16 1436    Clinical Impression Statement Patient progressing and started weight machines per MPT, no difficulty or pain. Patient would like to start 1 time a week for dry needling due to high co-pay, patient will join [employer] to continue exercises and she will cont with self stretching. Patient remaining goals ongoing due to ROM deficts.   Rehab Potential Excellent   PT Frequency 2x / week   PT Duration 8 weeks   PT Treatment/Interventions ADLs/Self Care Home Management;Cryotherapy;Electrical Stimulation;Therapeutic activities;Therapeutic exercise;Neuromuscular re-education;Patient/family education;Passive range of motion;Manual techniques;Vasopneumatic Device   PT Next Visit Plan continue with scapular mobs and subscap and pectorals release and dry needling per patient   Consulted and Agree with Plan of Care Patient      Patient will benefit from skilled therapeutic intervention in order to improve the following deficits and impairments:  Pain, Decreased range of motion, Decreased activity tolerance  Visit Diagnosis: Stiffness of left shoulder, not elsewhere classified  Acute pain of left shoulder     Problem List Patient Active Problem List   Diagnosis Date Noted  . Unspecified constipation 12/22/2012  . Bipolar disorder, unspecified 05/25/2012  . Post traumatic stress disorder (PTSD) 05/25/2012  . Arthritis 05/25/2012  . Depression 05/25/2012  . GERD (gastroesophageal reflux disease) 10/26/2011  . Abdominal pain, chronic, right upper quadrant  09/25/2011  . HYPOTHYROIDISM 01/22/2010  . UNSPECIFIED ANEMIA 01/22/2010  . ALOPECIA 01/22/2010  . CRAMP OF LIMB 01/22/2010    Ariel Ramirez, PTA 03/19/2016, 2:44 PM  [facility] [address] [place], [place], [address] Phone: [phone]   Fax:  [phone]  Name: Ariel Ramirez MRN: [id number] Date of Birth: Aug 08, 1960

## 2016-03-20 ENCOUNTER — Encounter: Payer: Self-pay | Admitting: *Deleted

## 2016-03-23 ENCOUNTER — Ambulatory Visit: Payer: Federal, State, Local not specified - PPO | Admitting: Physical Therapy

## 2016-03-23 DIAGNOSIS — M25612 Stiffness of left shoulder, not elsewhere classified: Secondary | ICD-10-CM

## 2016-03-23 NOTE — Patient Instructions (Signed)
  Flexibility: Upper Trapezius Stretch   Gently grasp right side of head while reaching behind back with other hand. Tilt head away until a gentle stretch is felt. Hold 30-60 seconds. Repeat 3 times per set. Do 2 sessions per day.  http://[email or web address].us/340   Levator Stretch   Grasp seat or sit on hand on side to be stretched. Turn head toward other side and look down. Use hand on head to gently stretch neck in that position. Hold _30___ seconds. Repeat on other side. Repeat 3 times. Do 2 sessions per day.  [email or web address]://[email or web address]   Scapular Retraction (Standing)   With arms at sides, pinch shoulder blades together. Repeat 10 times per set. Do 1-3 sets per session. Do 2 sessions per day.  http://[email or web address].exer.us/944   Thoracic Self-Mobilization (Sitting)   With small rolled towel at lower ribs level, gently lean back until stretch is felt. Hold ____ seconds. Relax. Repeat ____ times per set. Do ____ sets per session. Do ____ sessions per day.  http://orth.exer.us/998   Copyright  VHI. All rights reserved.  Thoracic Self-Mobilization Stretch (Supine)   With small rolled towel at lower ribs level, gently lie back until stretch is felt. Hold _1-5 min. Relax. Repeat ____ times per set. Do ____ sets per session. Do _2 ___ sessions per day.  http://orth.exer.us/994   Solon PalmJulie Audreyanna Butkiewicz, PT 03/23/16 2:34 PM [facility] [address] [place], [place], [address] Phone: [phone]   Fax:  [phone]   Copyright  VHI. All rights reserved.

## 2016-03-23 NOTE — Therapy (Signed)
[facility] [place], [place], [address] Phone: [phone]   Fax:  [phone]  Physical Therapy Treatment  Patient Details  Name: Ariel Ramirez MRN: [id number] Date of Birth: May 08, 1960 Referring Provider: Elsie Saas MD.  Encounter Date: 03/23/2016      PT End of Session - 03/23/16 1334    Visit Number 16   Number of Visits 24   Date for PT Re-Evaluation 04/20/16   PT Start Time 3710   PT Stop Time 6269  extra time spent educating spouse/measurements/goals   PT Time Calculation (min) 82 min   Activity Tolerance Patient tolerated treatment well   Behavior During Therapy [place] for tasks assessed/performed      Past Medical History:  Diagnosis Date  . Anxiety   . Arthritis   . Depression   . Hypothyroid     Past Surgical History:  Procedure Laterality Date  . CHOLECYSTECTOMY  11/13/2011   Procedure: LAPAROSCOPIC CHOLECYSTECTOMY;  Surgeon: Jamesetta So, MD;  Location: [facility];  Service: General;  Laterality: N/A;  . ESOPHAGOGASTRODUODENOSCOPY  11/09/2011   Procedure: ESOPHAGOGASTRODUODENOSCOPY (EGD);  Surgeon: Rogene Houston, MD;  Location: [facility];  Service: Endoscopy;  Laterality: N/A;  730  . PARTIAL HYSTERECTOMY     2011  . TUBAL LIGATION     20 yrs ago    There were no vitals filed for this visit.      Subjective Assessment - 03/23/16 1334    Subjective Overall patient feels she is doing really well, 80% better. She still has difficulty reaching behind her back as well as she does with the R shoulder. Pain just aggravates a little 1-2/10 constantly.   Patient is accompained by: Family member   Patient Stated Goals Use my right arm without pain.   Currently in Pain? Yes   Pain Score 1    Pain Location Shoulder   Pain Orientation Left   Pain Descriptors / Indicators Aching   Pain Type Surgical pain   Pain Onset More than a month ago   Pain Frequency Intermittent   Aggravating Factors   reaching behind   Pain Relieving Factors rest   Effect of Pain on Daily Activities not keeping her from doing anything            [facility] PT Assessment - 03/23/16 0001      AROM   AROM Assessment Site Shoulder   Right/Left Shoulder Left   Left Shoulder Internal Rotation 50 Degrees  65 after DN manual   Left Shoulder External Rotation 50 Degrees  65 after DN and manual     PROM   PROM Assessment Site Shoulder   Right/Left Shoulder Left   Left Shoulder Internal Rotation 57 Degrees   Left Shoulder External Rotation 57 Degrees  67 after manual                     [facility] Adult PT Treatment/Exercise - 03/23/16 0001      Self-Care   Self-Care Other Self-Care Comments  self MFR using tennis balls or manual pinch or elbow     Modalities   Modalities Electrical Stimulation;Moist Heat     Moist Heat Therapy   Number Minutes Moist Heat 15 Minutes   Moist Heat Location Shoulder     Electrical Stimulation   Electrical Stimulation Location L shldr IFC x 15 mins   Electrical Stimulation Goals Pain     Manual Therapy   Manual Therapy Soft  tissue mobilization;Myofascial release;Scapular mobilization;Passive ROM;Joint mobilization   Joint Mobilization T spine T3-7/8 PA grade III/IV   Soft tissue mobilization to L UT, lev scap, pectorals, subscapularis and teres muscles, thoracic paraspinals   Myofascial Release to thoracic paraspinals   Scapular Mobilization in [facility] for protraction   Passive ROM L shoulder into ER and IR          Trigger Point Dry Needling - 03/23/16 1444    Consent Given? Yes   Education Handout Provided No   Muscles Treated Upper Body Upper trapezius;Pectoralis major;Pectoralis minor;Levator scapulae;Infraspinatus;Subscapularis  L   Upper Trapezius Response Twitch reponse elicited;Palpable increased muscle length   Pectoralis Major Response Twitch response elicited;Palpable increased muscle length   Pectoralis Minor Response Twitch response  elicited;Palpable increased muscle length   Levator Scapulae Response Twitch response elicited;Palpable increased muscle length   Infraspinatus Response Twitch response elicited;Palpable increased muscle length   Subscapularis Response Twitch response elicited;Palpable increased muscle length              PT Education - 03/23/16 1805    Education provided Yes   Education Details HEP; self care   Person(s) Educated Patient;Spouse   Methods Explanation;Demonstration;Verbal cues;Handout   Comprehension Verbalized understanding             PT Long Term Goals - 03/23/16 1337      PT LONG TERM GOAL #1   Title Independent with a HEP.   Time 8   Period Weeks   Status Achieved     PT LONG TERM GOAL #2   Title Active left shoulder flexion to 155 degrees so the patient can easily reach overhead   Baseline 170 degrees in supine.   Time 8   Period Weeks   Status Achieved     PT LONG TERM GOAL #3   Title Active left shoulder ER to 80 degrees+ to allow for easily donning/doffing of apparel   Period Weeks   Status On-going     PT LONG TERM GOAL #4   Title Perform ADL's with pain not > 3/10.   Time 8   Period Weeks   Status Achieved     PT LONG TERM GOAL #5   Title Increase ROM so patient is able to reach behind back to L3.   Baseline below L5 03/23/16   Time 8   Period Weeks   Status On-going               Plan - 03/23/16 1808    Clinical Impression Statement Patient presented today with her husband who was present to learn how he could help with STW/MFR at home. Patient's POC was discussed and it was determined that she would benefit from further PT primarily for ROM deficits. She responded very well to DN and STW/MFR as well as joint mobs demonstrating significant gains in L active shoulder ER and IR (15 deg each) afterwards. She was also able to put her hand behind her back to L4 following treatment. PT advised and demonstrated numerous stretching alternatives  to maintain gains in ER as well as MFR techniques. She has met 3/5 LTGs as of today, and is progressing with ROM, however ROM gains have been inconsistent.    Rehab Potential Excellent   PT Frequency 2x / week   PT Duration 4 weeks   PT Treatment/Interventions ADLs/Self Care Home Management;Cryotherapy;Electrical Stimulation;Therapeutic activities;Therapeutic exercise;Neuromuscular re-education;Patient/family education;Passive range of motion;Manual techniques;Vasopneumatic Device   PT Next Visit Plan Focus on ROM; continue with scapular  mobs for protraction, thoracic mobs and subscap and pectorals release and dry needling prn.   PT Home Exercise Plan UT/lev scap stretches; supine ant pec stretch off bed; ER stretch in supine; serratus punch supine; supine cane IR/ER/flex; thoracic ext; scapular retraction   Consulted and Agree with Plan of Care Patient;Family member/caregiver   Family Member Consulted spouse      Patient will benefit from skilled therapeutic intervention in order to improve the following deficits and impairments:  Pain, Decreased range of motion, Decreased activity tolerance  Visit Diagnosis: Stiffness of left shoulder, not elsewhere classified - Plan: PT plan of care cert/re-cert     Problem List Patient Active Problem List   Diagnosis Date Noted  . Unspecified constipation 12/22/2012  . Bipolar disorder, unspecified 05/25/2012  . Post traumatic stress disorder (PTSD) 05/25/2012  . Arthritis 05/25/2012  . Depression 05/25/2012  . GERD (gastroesophageal reflux disease) 10/26/2011  . Abdominal pain, chronic, right upper quadrant 09/25/2011  . HYPOTHYROIDISM 01/22/2010  . UNSPECIFIED ANEMIA 01/22/2010  . ALOPECIA 01/22/2010  . CRAMP OF LIMB 01/22/2010    Madelyn Flavors PT 03/23/2016, 6:31 PM  [facility] [address] [place], [place], [address] Phone: [phone]   Fax:  [phone]  Name: Ariel Ramirez MRN:  [id number] Date of Birth: 04-29-1960

## 2016-03-25 ENCOUNTER — Encounter: Payer: Self-pay | Admitting: Physical Therapy

## 2016-03-25 ENCOUNTER — Ambulatory Visit: Payer: Federal, State, Local not specified - PPO | Admitting: Physical Therapy

## 2016-03-25 DIAGNOSIS — M25612 Stiffness of left shoulder, not elsewhere classified: Secondary | ICD-10-CM

## 2016-03-25 DIAGNOSIS — M25512 Pain in left shoulder: Secondary | ICD-10-CM

## 2016-03-25 NOTE — Therapy (Signed)
[facility] [address] [place], [place], [address] Phone: [phone]   Fax:  [phone]  Physical Therapy Treatment  Patient Details  Name: Ariel Ramirez MRN: [id number] Date of Birth: 19-Feb-1961 Referring Provider: Salvatore Marvel MD.  Encounter Date: 03/25/2016      PT End of Session - 03/25/16 1344    Visit Number 17   Number of Visits 24   Date for PT Re-Evaluation 04/20/16   PT Start Time 1345   PT Stop Time 1444   PT Time Calculation (min) 59 min   Activity Tolerance Patient tolerated treatment well   Behavior During Therapy [facility] for tasks assessed/performed      Past Medical History:  Diagnosis Date  . Anxiety   . Arthritis   . Depression   . Hypothyroid     Past Surgical History:  Procedure Laterality Date  . CHOLECYSTECTOMY  11/13/2011   Procedure: LAPAROSCOPIC CHOLECYSTECTOMY;  Surgeon: Dalia Heading, MD;  Location: [facility];  Service: General;  Laterality: N/A;  . ESOPHAGOGASTRODUODENOSCOPY  11/09/2011   Procedure: ESOPHAGOGASTRODUODENOSCOPY (EGD);  Surgeon: Malissa Hippo, MD;  Location: [facility];  Service: Endoscopy;  Laterality: N/A;  730  . PARTIAL HYSTERECTOMY     2011  . TUBAL LIGATION     20 yrs ago    There were no vitals filed for this visit.      Subjective Assessment - 03/25/16 1344    Subjective Reported having husband try the manual therapy techniques he observed but had him stop as he was providing too much pressure.   Patient Stated Goals Use my right arm without pain.   Currently in Pain? Other (Comment)  No pain rating provided by patient            [facility] PT Assessment - 03/25/16 0001      Assessment   Medical Diagnosis Left shoulder manipulation.   Onset Date/Surgical Date 02/12/16   Next MD Visit 04/13/2016     ROM / Strength   AROM / PROM / Strength AROM     AROM   Overall AROM  Deficits   AROM Assessment Site Shoulder   Right/Left Shoulder Left   Left Shoulder  Internal Rotation 62 Degrees   Left Shoulder External Rotation 70 Degrees                     [facility] Adult PT Treatment/Exercise - 03/25/16 0001      Shoulder Exercises: Supine   External Rotation AAROM;Both;20 reps  10 sec hold      Shoulder Exercises: Standing   Other Standing Exercises LUE ranger flexion x20 reps, LUE ER stretch 10x 10 sec holds     Shoulder Exercises: ROM/Strengthening   UBE (Upper Arm Bike) 90 RPM x 6 min     Shoulder Exercises: Stretch   Internal Rotation Stretch 3 reps   Internal Rotation Stretch Limitations 30 sec hold in sleep stretch position     Modalities   Modalities Electrical Stimulation;Moist Heat     Moist Heat Therapy   Number Minutes Moist Heat 15 Minutes   Moist Heat Location Shoulder     Electrical Stimulation   Electrical Stimulation Location L shoulder   Electrical Stimulation Action Pre-Mod   Electrical Stimulation Parameters 80-150 hz x15 min   Electrical Stimulation Goals Pain     Manual Therapy   Manual Therapy Joint mobilization;Passive ROM   Joint Mobilization L Glenohumeral joint mobilizations G III-IV in A/P/I directions  to promote increased ROM   Passive ROM PROM of L shoulder into flex/ER/IR followed GH joint mobilizations to improve L shoulder ROM with holds at end range                     PT Long Term Goals - 03/23/16 1337      PT LONG TERM GOAL #1   Title Independent with a HEP.   Time 8   Period Weeks   Status Achieved     PT LONG TERM GOAL #2   Title Active left shoulder flexion to 155 degrees so the patient can easily reach overhead   Baseline 170 degrees in supine.   Time 8   Period Weeks   Status Achieved     PT LONG TERM GOAL #3   Title Active left shoulder ER to 80 degrees+ to allow for easily donning/doffing of apparel   Period Weeks   Status On-going     PT LONG TERM GOAL #4   Title Perform ADL's with pain not > 3/10.   Time 8   Period Weeks   Status Achieved      PT LONG TERM GOAL #5   Title Increase ROM so patient is able to reach behind back to L3.   Baseline below L5 03/23/16   Time 8   Period Weeks   Status On-going               Plan - 03/25/16 1450    Clinical Impression Statement Patient arrived to treatment with encouragment to continue PT in order to obtain improved L shoulder ROM. Patient was guided through various shoulder stretches especially for shoulder rotation with patient experiencing greatest stretch with ER overhead in supine with cane and L sidelying sleeper stretch. Patient able to tolerate grade III-IV glenohumeral stretches in A/P/I directions to assist with improving ROM without complaint. Joint mobilizations followed by PROM with holds at end range to further improve ROM without complaint from patient. AROM L shoulder ER measured as 70 deg today, and IR as 62 deg in supine. Normal modalities response noted following removal of the modalities.   Rehab Potential Excellent   PT Frequency 2x / week   PT Duration 4 weeks   PT Treatment/Interventions ADLs/Self Care Home Management;Cryotherapy;Electrical Stimulation;Therapeutic activities;Therapeutic exercise;Neuromuscular re-education;Patient/family education;Passive range of motion;Manual techniques;Vasopneumatic Device   PT Next Visit Plan Focus on ROM; continue with scapular mobs for protraction, thoracic mobs and subscap and pectorals release and dry needling prn.   PT Home Exercise Plan UT/lev scap stretches; supine ant pec stretch off bed; ER stretch in supine; serratus punch supine; supine cane IR/ER/flex; thoracic ext; scapular retraction   Consulted and Agree with Plan of Care Patient      Patient will benefit from skilled therapeutic intervention in order to improve the following deficits and impairments:  Pain, Decreased range of motion, Decreased activity tolerance  Visit Diagnosis: Stiffness of left shoulder, not elsewhere classified  Acute pain of left  shoulder     Problem List Patient Active Problem List   Diagnosis Date Noted  . Unspecified constipation 12/22/2012  . Bipolar disorder, unspecified 05/25/2012  . Post traumatic stress disorder (PTSD) 05/25/2012  . Arthritis 05/25/2012  . Depression 05/25/2012  . GERD (gastroesophageal reflux disease) 10/26/2011  . Abdominal pain, chronic, right upper quadrant 09/25/2011  . HYPOTHYROIDISM 01/22/2010  . UNSPECIFIED ANEMIA 01/22/2010  . ALOPECIA 01/22/2010  . CRAMP OF LIMB 01/22/2010    Evelene CroonKelsey M Parsons, PTA 03/25/2016,  2:56 PM  [facility] [address] [place], [place], [address] Phone: [phone]   Fax:  [phone]  Name: Ariel Ramirez MRN: [id number] Date of Birth: 1960/05/05

## 2016-03-30 ENCOUNTER — Ambulatory Visit: Payer: Federal, State, Local not specified - PPO | Admitting: Physical Therapy

## 2016-03-30 DIAGNOSIS — M25512 Pain in left shoulder: Secondary | ICD-10-CM

## 2016-03-30 DIAGNOSIS — M25612 Stiffness of left shoulder, not elsewhere classified: Secondary | ICD-10-CM

## 2016-03-30 NOTE — Therapy (Signed)
[facility] [address] [place], [place], [address] Phone: [phone]   Fax:  [phone]  Physical Therapy Treatment  Patient Details  Name: Ariel Ramirez MRN: [id number] Date of Birth: Jul 16, 1960 Referring Provider: Salvatore Marvelobert Wainer MD.  Encounter Date: 03/30/2016      PT End of Session - 03/30/16 1032    Visit Number 18   Number of Visits 24   Date for PT Re-Evaluation 04/20/16   PT Start Time 1030   PT Stop Time 1127   PT Time Calculation (min) 57 min   Activity Tolerance Patient tolerated treatment well   Behavior [facility] for tasks assessed/performed      Past Medical History:  Diagnosis Date  . Anxiety   . Arthritis   . Depression   . Hypothyroid     Past Surgical History:  Procedure Laterality Date  . CHOLECYSTECTOMY  11/13/2011   Procedure: LAPAROSCOPIC CHOLECYSTECTOMY;  Surgeon: Dalia HeadingMark A Jenkins, MD;  Location: [facility];  Service: General;  Laterality: N/A;  . ESOPHAGOGASTRODUODENOSCOPY  11/09/2011   Procedure: ESOPHAGOGASTRODUODENOSCOPY (EGD);  Surgeon: Malissa HippoNajeeb U Rehman, MD;  Location: [facility];  Service: Endoscopy;  Laterality: N/A;  730  . PARTIAL HYSTERECTOMY     2011  . TUBAL LIGATION     20 yrs ago    There were no vitals filed for this visit.      Subjective Assessment - 03/30/16 1033    Subjective Patient reports no significant improvement from the dry needling. Her pain is about the same. It hurts during the day. "Nothing major".   Patient Stated Goals Use my right arm without pain.   Currently in Pain? Yes   Pain Score 1    Pain Location Shoulder   Pain Orientation Left   Pain Descriptors / Indicators Aching   Pain Type Surgical pain   Pain Onset More than a month ago   Aggravating Factors  reaching behind, but "a whole lot better than it was"   Pain Relieving Factors rest            [facility] Assessment - 03/30/16 0001      AROM   Left Shoulder Internal Rotation 58 Degrees      Strength   Overall Strength Comments L shoulder flex 4-/5, ER 5/5, else 4+/5                     [facility] Adult PT Treatment/Exercise - 03/30/16 0001      Shoulder Exercises: Supine   Flexion AROM;Left;20 reps  with engagement of core   Other Supine Exercises pec stretch off 8 inch roller     Shoulder Exercises: Prone   Extension Strengthening;Left;20 reps;Weights   Extension Weight (lbs) 2   Horizontal ABduction 1 Strengthening;Left;20 reps  palm down   Horizontal ABduction 2 Strengthening;Left;20 reps  palm down   Other Prone Exercises row 2# x 20 reps     Shoulder Exercises: Standing   Other Standing Exercises flexion/scaption 2x10 2#; 5 sec hold on second set     Shoulder Exercises: Pulleys   Flexion Other (comment)  5 min     Shoulder Exercises: ROM/Strengthening   Other ROM/Strengthening Exercises lat pull 2 plt narrow and wide 2x10 each     Modalities   Modalities Electrical Stimulation     Moist Heat Therapy   Number Minutes Moist Heat 15 Minutes   Moist Heat Location Shoulder     Electrical Stimulation   Electrical  Stimulation Location L shldr premod x 15 mins   Electrical Stimulation Goals Pain     Manual Therapy   Manual Therapy Scapular mobilization;Joint mobilization   Joint Mobilization lumbar and thoracic PA mobs; L PA lumbar WNL but tender per patient   Scapular Mobilization for IR in sitting and prone with active IR                     PT Long Term Goals - 03/23/16 1337      PT LONG TERM GOAL #1   Title Independent with a HEP.   Time 8   Period Weeks   Status Achieved     PT LONG TERM GOAL #2   Title Active left shoulder flexion to 155 degrees so the patient can easily reach overhead   Baseline 170 degrees in supine.   Time 8   Period Weeks   Status Achieved     PT LONG TERM GOAL #3   Title Active left shoulder ER to 80 degrees+ to allow for easily donning/doffing of apparel   Period Weeks   Status  On-going     PT LONG TERM GOAL #4   Title Perform ADL's with pain not > 3/10.   Time 8   Period Weeks   Status Achieved     PT LONG TERM GOAL #5   Title Increase ROM so patient is able to reach behind back to L3.   Baseline below L5 03/23/16   Time 8   Period Weeks   Status On-going               Plan - 03/30/16 1228    Clinical Impression Statement Patient continues to report  steady low level pain in her left sholder with ADLs. She has good scapular, g/h joint mobility and spinal mobility. She does demonstrate shoulder weakness with MMT. She responded well to new strenthening exercises, but reports noncompliance with pec stretch on foam roller. This was reviewed with patient and emphasized as ROM deficits appear to be soft tissue based. Normal response to modalities.   PT Treatment/Interventions ADLs/Self Care Home Management;Cryotherapy;Electrical Stimulation;Therapeutic activities;Therapeutic exercise;Neuromuscular re-education;Patient/family education;Passive range of motion;Manual techniques;Vasopneumatic Device   PT Next Visit Plan Focus on ROM      Patient will benefit from skilled therapeutic intervention in order to improve the following deficits and impairments:  Pain, Decreased range of motion, Decreased activity tolerance  Visit Diagnosis: Stiffness of left shoulder, not elsewhere classified  Acute pain of left shoulder     Problem List Patient Active Problem List   Diagnosis Date Noted  . Unspecified constipation 12/22/2012  . Bipolar disorder, unspecified 05/25/2012  . Post traumatic stress disorder (PTSD) 05/25/2012  . Arthritis 05/25/2012  . Depression 05/25/2012  . GERD (gastroesophageal reflux disease) 10/26/2011  . Abdominal pain, chronic, right upper quadrant 09/25/2011  . HYPOTHYROIDISM 01/22/2010  . UNSPECIFIED ANEMIA 01/22/2010  . ALOPECIA 01/22/2010  . CRAMP OF LIMB 01/22/2010    Solon Palm PT 03/30/2016, 12:35 PM  [facility] [address] [place], [place], [address] Phone: [phone]   Fax:  [phone]  Name: LAWONDA PRETLOW MRN: [id number] Date of Birth: [birth year]

## 2016-04-03 ENCOUNTER — Ambulatory Visit: Payer: Federal, State, Local not specified - PPO | Admitting: *Deleted

## 2016-04-03 DIAGNOSIS — M25612 Stiffness of left shoulder, not elsewhere classified: Secondary | ICD-10-CM

## 2016-04-03 DIAGNOSIS — M25512 Pain in left shoulder: Secondary | ICD-10-CM

## 2016-04-03 NOTE — Therapy (Signed)
[facility] [address] [place], [place], [address] Phone: [phone]   Fax:  [phone]  Physical Therapy Treatment  Patient Details  Name: Ariel Ramirez MRN: [id number] Date of Birth: 16-Aug-1960 Referring Provider: Salvatore Marvel MD.  Encounter Date: 04/03/2016      PT End of Session - 04/03/16 1156    Visit Number 19   Number of Visits 24   Date for PT Re-Evaluation 04/20/16   PT Start Time 1040   PT Stop Time 1137   PT Time Calculation (min) 57 min      Past Medical History:  Diagnosis Date  . Anxiety   . Arthritis   . Depression   . Hypothyroid     Past Surgical History:  Procedure Laterality Date  . CHOLECYSTECTOMY  11/13/2011   Procedure: LAPAROSCOPIC CHOLECYSTECTOMY;  Surgeon: Dalia Heading, MD;  Location: [facility];  Service: General;  Laterality: N/A;  . ESOPHAGOGASTRODUODENOSCOPY  11/09/2011   Procedure: ESOPHAGOGASTRODUODENOSCOPY (EGD);  Surgeon: Malissa Hippo, MD;  Location: [facility];  Service: Endoscopy;  Laterality: N/A;  730  . PARTIAL HYSTERECTOMY     2011  . TUBAL LIGATION     20 yrs ago    There were no vitals filed for this visit.      Subjective Assessment - 04/03/16 1042    Subjective RT shldr 1/10. Increased pain with stretching   Patient Stated Goals Use my right arm without pain.   Currently in Pain? Yes   Pain Score 1    Pain Location Shoulder   Pain Orientation Left   Pain Descriptors / Indicators Aching   Pain Type Surgical pain   Pain Onset More than a month ago   Pain Frequency Intermittent                         [facility] Adult PT Treatment/Exercise - 04/03/16 0001      Shoulder Exercises: Prone   Extension Strengthening;Left;20 reps;Weights   Extension Weight (lbs) 2   Horizontal ABduction 1 Strengthening;Left;20 reps  palm down   Horizontal ABduction 2 Strengthening;Left;20 reps  palm down   Other Prone Exercises row 2# x 20 reps     Shoulder  Exercises: Standing   Other Standing Exercises flexion/scaption 3x10 2#; 5 sec hold on second set     Shoulder Exercises: Pulleys   Flexion Other (comment)  5 min     Shoulder Exercises: ROM/Strengthening   UBE (Upper Arm Bike) 90 RPM x 6 min   Other ROM/Strengthening Exercises lat pull 2 plt narrow and wide 2x10 each     Shoulder Exercises: Stretch   Internal Rotation Stretch 3 reps   Internal Rotation Stretch Limitations 30 sec hold in sleep stretch position     Modalities   Modalities Electrical Stimulation     Moist Heat Therapy   Number Minutes Moist Heat 15 Minutes   Moist Heat Location Shoulder     Electrical Stimulation   Electrical Stimulation Location L shldr premod x 15 mins   Electrical Stimulation Goals Pain     Manual Therapy   Manual Therapy Scapular mobilization;Joint mobilization   Passive ROM Posterior shldr mobs in supine                     PT Long Term Goals - 03/23/16 1337      PT LONG TERM GOAL #1   Title Independent with a HEP.   Time  8   Period Weeks   Status Achieved     PT LONG TERM GOAL #2   Title Active left shoulder flexion to 155 degrees so the patient can easily reach overhead   Baseline 170 degrees in supine.   Time 8   Period Weeks   Status Achieved     PT LONG TERM GOAL #3   Title Active left shoulder ER to 80 degrees+ to allow for easily donning/doffing of apparel   Period Weeks   Status On-going     PT LONG TERM GOAL #4   Title Perform ADL's with pain not > 3/10.   Time 8   Period Weeks   Status Achieved     PT LONG TERM GOAL #5   Title Increase ROM so patient is able to reach behind back to L3.   Baseline below L5 03/23/16   Time 8   Period Weeks   Status On-going               Plan - 04/03/16 1129    Clinical Impression Statement Pt did fairly well today with focus on strengthening and ROM.  She is still lacking ROM for her hand behind her back and this is her chief complaint. Normal response  to modalities.    Rehab Potential Excellent   PT Frequency 2x / week   PT Duration 4 weeks   PT Treatment/Interventions ADLs/Self Care Home Management;Cryotherapy;Electrical Stimulation;Therapeutic activities;Therapeutic exercise;Neuromuscular re-education;Patient/family education;Passive range of motion;Manual techniques;Vasopneumatic Device   PT Next Visit Plan Focus on ROM   PT Home Exercise Plan UT/lev scap stretches; supine ant pec stretch off bed; ER stretch in supine; serratus punch supine; supine cane IR/ER/flex; thoracic ext; scapular retraction   Consulted and Agree with Plan of Care Patient   Family Member Consulted spouse      Patient will benefit from skilled therapeutic intervention in order to improve the following deficits and impairments:  Pain, Decreased range of motion, Decreased activity tolerance  Visit Diagnosis: Stiffness of left shoulder, not elsewhere classified  Acute pain of left shoulder     Problem List Patient Active Problem List   Diagnosis Date Noted  . Unspecified constipation 12/22/2012  . Bipolar disorder, unspecified 05/25/2012  . Post traumatic stress disorder (PTSD) 05/25/2012  . Arthritis 05/25/2012  . Depression 05/25/2012  . GERD (gastroesophageal reflux disease) 10/26/2011  . Abdominal pain, chronic, right upper quadrant 09/25/2011  . HYPOTHYROIDISM 01/22/2010  . UNSPECIFIED ANEMIA 01/22/2010  . ALOPECIA 01/22/2010  . CRAMP OF LIMB 01/22/2010    Lucerito Rosinski,CHRIS, PTA 04/03/2016, 12:05 PM  [facility] [address] [place], [place], [address] Phone: [phone]   Fax:  [phone]  Name: Marcine Matarngela C Yang MRN: [id number] Date of Birth: 12/19/1960

## 2016-04-08 ENCOUNTER — Ambulatory Visit: Payer: Federal, State, Local not specified - PPO | Admitting: Physical Therapy

## 2016-04-08 ENCOUNTER — Encounter: Payer: Self-pay | Admitting: Physical Therapy

## 2016-04-08 DIAGNOSIS — M25612 Stiffness of left shoulder, not elsewhere classified: Secondary | ICD-10-CM | POA: Diagnosis not present

## 2016-04-08 DIAGNOSIS — M25512 Pain in left shoulder: Secondary | ICD-10-CM

## 2016-04-08 NOTE — Therapy (Signed)
[facility] [address] [place], [place], [address] Phone: [phone]   Fax:  [phone]  Physical Therapy Treatment  Patient Details  Name: Ariel Ramirez MRN: [id number] Date of Birth: 04/13/1960 Referring Provider: Salvatore Marvel MD.  Encounter Date: 04/08/2016      PT End of Session - 04/08/16 1102    Visit Number 20   Number of Visits 24   Date for PT Re-Evaluation 04/20/16   PT Start Time 1029   PT Stop Time 1128   PT Time Calculation (min) 59 min   Activity Tolerance Patient tolerated treatment well   Behavior During Therapy [facility] for tasks assessed/performed      Past Medical History:  Diagnosis Date  . Anxiety   . Arthritis   . Depression   . Hypothyroid     Past Surgical History:  Procedure Laterality Date  . CHOLECYSTECTOMY  11/13/2011   Procedure: LAPAROSCOPIC CHOLECYSTECTOMY;  Surgeon: Dalia Heading, MD;  Location: [facility];  Service: General;  Laterality: N/A;  . ESOPHAGOGASTRODUODENOSCOPY  11/09/2011   Procedure: ESOPHAGOGASTRODUODENOSCOPY (EGD);  Surgeon: Malissa Hippo, MD;  [facility];  Service: Endoscopy;  Laterality: N/A;  730  . PARTIAL HYSTERECTOMY     2011  . TUBAL LIGATION     20 yrs ago    There were no vitals filed for this visit.      Subjective Assessment - 04/08/16 1042    Subjective patient has no pain only increased soreness in collar bone area for unknown reason   Patient Stated Goals Use my right arm without pain.   Currently in Pain? No/denies            [facility] PT Assessment - 04/08/16 0001      AROM   AROM Assessment Site Shoulder   Right/Left Shoulder Left   Left Shoulder Internal Rotation --  L4   Left Shoulder External Rotation 70 Degrees     PROM   PROM Assessment Site Shoulder   Right/Left Shoulder Left   Left Shoulder External Rotation 80 Degrees                     [facility] Adult PT Treatment/Exercise - 04/08/16 0001      Shoulder  Exercises: Prone   Extension Strengthening;Left;Weights  3x10   Extension Weight (lbs) 2   Horizontal ABduction 1 Strengthening;Left;20 reps   Horizontal ABduction 2 Strengthening;Left;20 reps   Other Prone Exercises row 2# 3x10     Shoulder Exercises: Standing   Other Standing Exercises flexion/scaption 3x10 2#; 5 sec hold on second set     Shoulder Exercises: Pulleys   Flexion Other (comment)      Shoulder Exercises: ROM/Strengthening   UBE (Upper Arm Bike) 90 RPM x 6 min   Other ROM/Strengthening Exercises lat pull 2 plt narrow and wide 2x10 each     Moist Heat Therapy   Number Minutes Moist Heat 15 Minutes   Moist Heat Location Shoulder     Electrical Stimulation   Electrical Stimulation Location L shldr premod x 15 mins     Manual Therapy   Manual Therapy Scapular mobilization;Joint mobilization;Passive ROM   Passive ROM Posterior shldr mobs and PROM to increase shoulder ROM                     PT Long Term Goals - 04/08/16 1103      PT LONG TERM GOAL #1   Title Independent  with a HEP.   Time 8   Period Weeks   Status Achieved     PT LONG TERM GOAL #2   Title Active left shoulder flexion to 155 degrees so the patient can easily reach overhead   Baseline 170 degrees in supine.   Time 8   Period Weeks   Status Achieved     PT LONG TERM GOAL #3   Title Active left shoulder ER to 80 degrees+ to allow for easily donning/doffing of apparel   Time 8   Period Weeks   Status On-going  AROM 70 degrees PROM 80 degrees 04/08/16     PT LONG TERM GOAL #4   Title Perform ADL's with pain not > 3/10.   Time 8   Period Weeks   Status Achieved     PT LONG TERM GOAL #5   Title Increase ROM so patient is able to reach behind back to L3.   Baseline below L5 03/23/16   Time 8   Period Weeks   Status On-going  L4 04/08/16               Plan - 04/08/16 1124    Clinical Impression Statement Patient continues to progress overall with little  soreness. Patient is progressing with strengthening exercises with good technique. Patient has improved with ROM for left shoulder ER today and has full range passively. Patient still has difficulty with reaching behind back. Patient unable to meet remaining ROM goals due to deficts.   Rehab Potential Excellent   PT Frequency 2x / week   PT Duration 4 weeks   PT Treatment/Interventions ADLs/Self Care Home Management;Cryotherapy;Electrical Stimulation;Therapeutic activities;Therapeutic exercise;Neuromuscular re-education;Patient/family education;Passive range of motion;Manual techniques;Vasopneumatic Device   PT Next Visit Plan cont with POC per MD discression (MD. Wainer1/25/18)   Consulted and Agree with Plan of Care Patient      Patient will benefit from skilled therapeutic intervention in order to improve the following deficits and impairments:  Pain, Decreased range of motion, Decreased activity tolerance  Visit Diagnosis: Stiffness of left shoulder, not elsewhere classified  Acute pain of left shoulder     Problem List Patient Active Problem List   Diagnosis Date Noted  . Unspecified constipation 12/22/2012  . Bipolar disorder, unspecified 05/25/2012  . Post traumatic stress disorder (PTSD) 05/25/2012  . Arthritis 05/25/2012  . Depression 05/25/2012  . GERD (gastroesophageal reflux disease) 10/26/2011  . Abdominal pain, chronic, right upper quadrant 09/25/2011  . HYPOTHYROIDISM 01/22/2010  . UNSPECIFIED ANEMIA 01/22/2010  . ALOPECIA 01/22/2010  . CRAMP OF LIMB 01/22/2010    Cathie HoopsChristina Tom Ragsdale, PTA 04/08/16 11:33 AM Italyhad Applegate MPT [facility] [address] [place], [place], [address] Phone: [phone]   Fax:  [phone]  Name: Ariel Ramirez MRN: [id number] Date of Birth: Feb 25, 1961

## 2016-04-10 ENCOUNTER — Ambulatory Visit: Payer: Federal, State, Local not specified - PPO | Admitting: *Deleted

## 2016-04-10 DIAGNOSIS — M25612 Stiffness of left shoulder, not elsewhere classified: Secondary | ICD-10-CM

## 2016-04-10 DIAGNOSIS — M25512 Pain in left shoulder: Secondary | ICD-10-CM

## 2016-04-10 NOTE — Therapy (Addendum)
[facility] [place], [place], [address] Phone: [phone]   Fax:  [phone]  Physical Therapy Treatment  Patient Details  Name: Ariel Ramirez MRN: [id number] Date of Birth: 17-Apr-1960 Referring Provider: Elsie Saas MD.  Encounter Date: 04/10/2016      PT End of Session - 04/10/16 1100    Visit Number 21   Number of Visits 24   Date for PT Re-Evaluation 04/20/16   PT Start Time 1030   PT Stop Time 1130   PT Time Calculation (min) 60 min      Past Medical History:  Diagnosis Date  . Anxiety   . Arthritis   . Depression   . Hypothyroid     Past Surgical History:  Procedure Laterality Date  . CHOLECYSTECTOMY  11/13/2011   Procedure: LAPAROSCOPIC CHOLECYSTECTOMY;  Surgeon: Jamesetta So, MD;  Location: [facility];  Service: General;  Laterality: N/A;  . ESOPHAGOGASTRODUODENOSCOPY  11/09/2011   Procedure: ESOPHAGOGASTRODUODENOSCOPY (EGD);  Surgeon: Rogene Houston, MD;  Location[facility];  Service: Endoscopy;  Laterality: N/A;  730  . PARTIAL HYSTERECTOMY     2011  . TUBAL LIGATION     20 yrs ago    There were no vitals filed for this visit.                       [facility] Adult PT Treatment/Exercise - 04/10/16 0001      Shoulder Exercises: Prone   Extension Strengthening;Left;Weights  3x10   Extension Weight (lbs) 2   Horizontal ABduction 1 Strengthening;Left;20 reps   Horizontal ABduction 2 Strengthening;Left;20 reps   Other Prone Exercises row 3# 3x10     Shoulder Exercises: Standing   Other Standing Exercises flexion/scaption 3x10 2#; 5 sec hold on second set     Shoulder Exercises: ROM/Strengthening   UBE (Upper Arm Bike) 90 RPM x 45mn   Other ROM/Strengthening Exercises lat pull 2 plt narrow and wide 2x10 each     Moist Heat Therapy   Number Minutes Moist Heat 15 Minutes   Moist Heat Location Shoulder     Electrical Stimulation   Electrical Stimulation Location L  shldr premod x 15 mins   Electrical Stimulation Goals Pain     Manual Therapy   Passive ROM Posterior shldr mobs in supine                     PT Long Term Goals - 04/08/16 1103      PT LONG TERM GOAL #1   Title Independent with a HEP.   Time 8   Period Weeks   Status Achieved     PT LONG TERM GOAL #2   Title Active left shoulder flexion to 155 degrees so the patient can easily reach overhead   Baseline 170 degrees in supine.   Time 8   Period Weeks   Status Achieved     PT LONG TERM GOAL #3   Title Active left shoulder ER to 80 degrees+ to allow for easily donning/doffing of apparel   Time 8   Period Weeks   Status On-going  AROM 70 degrees PROM 80 degrees 04/08/16     PT LONG TERM GOAL #4   Title Perform ADL's with pain not > 3/10.   Time 8   Period Weeks   Status Achieved     PT LONG TERM GOAL #5   Title Increase ROM so patient is able to  reach behind back to L3.   Baseline below L5 03/23/16   Time 8   Period Weeks   Status On-going  L4 04/08/16               Plan - 04/10/16 1102    Clinical Impression Statement Pt did great today with Rx and was able to perform all exs with minimal pain. Her ER ROM was 80 degrees at 90 degrees of ABD, but 70 in scaption. IR HBB was at L5. ROM goals are ongoing due to deficts   Rehab Potential Excellent   PT Frequency 2x / week   PT Duration 4 weeks   PT Treatment/Interventions ADLs/Self Care Home Management;Cryotherapy;Electrical Stimulation;Therapeutic activities;Therapeutic exercise;Neuromuscular re-education;Patient/family education;Passive range of motion;Manual techniques;Vasopneumatic Device   PT Next Visit Plan cont with POC per MD discression (MD. Wainer1/25/18)   PT Home Exercise Plan UT/lev scap stretches; supine ant pec stretch off bed; ER stretch in supine; serratus punch supine; supine cane IR/ER/flex; thoracic ext; scapular retraction   Consulted and Agree with Plan of Care Patient       Patient will benefit from skilled therapeutic intervention in order to improve the following deficits and impairments:  Pain, Decreased range of motion, Decreased activity tolerance  Visit Diagnosis: Stiffness of left shoulder, not elsewhere classified  Acute pain of left shoulder     Problem List Patient Active Problem List   Diagnosis Date Noted  . Unspecified constipation 12/22/2012  . Bipolar disorder, unspecified 05/25/2012  . Post traumatic stress disorder (PTSD) 05/25/2012  . Arthritis 05/25/2012  . Depression 05/25/2012  . GERD (gastroesophageal reflux disease) 10/26/2011  . Abdominal pain, chronic, right upper quadrant 09/25/2011  . HYPOTHYROIDISM 01/22/2010  . UNSPECIFIED ANEMIA 01/22/2010  . ALOPECIA 01/22/2010  . CRAMP OF LIMB 01/22/2010    RAMSEUR,CHRIS, PTA 04/10/2016, 12:25 PM  [facility] [address] [place], [place], [address] Phone: [phone]   Fax:  [phone]  Name: Ariel Ramirez MRN: [id number] Date of Birth: [birth year] PHYSICAL THERAPY DISCHARGE SUMMARY  Visits from Start of Care: 21.  Current functional level related to goals / functional outcomes: See above.   Remaining deficits: Goals essentially met.   Education / Equipment: HEP. Plan: Patient agrees to discharge.  Patient goals were met. Patient is being discharged due to meeting the stated rehab goals.  ?????        Mali Applegate MPT

## 2016-04-16 ENCOUNTER — Encounter: Payer: Self-pay | Admitting: Physical Therapy

## 2016-04-28 ENCOUNTER — Ambulatory Visit: Payer: Federal, State, Local not specified - PPO | Attending: Orthopedic Surgery | Admitting: Physical Therapy

## 2016-12-16 DIAGNOSIS — Z79899 Other long term (current) drug therapy: Secondary | ICD-10-CM | POA: Diagnosis not present

## 2016-12-16 DIAGNOSIS — Z6827 Body mass index (BMI) 27.0-27.9, adult: Secondary | ICD-10-CM | POA: Diagnosis not present

## 2016-12-16 DIAGNOSIS — R0789 Other chest pain: Secondary | ICD-10-CM | POA: Diagnosis not present

## 2016-12-16 DIAGNOSIS — R079 Chest pain, unspecified: Secondary | ICD-10-CM | POA: Diagnosis not present

## 2016-12-16 DIAGNOSIS — E039 Hypothyroidism, unspecified: Secondary | ICD-10-CM | POA: Diagnosis not present

## 2016-12-28 DIAGNOSIS — R079 Chest pain, unspecified: Secondary | ICD-10-CM | POA: Diagnosis not present

## 2016-12-28 DIAGNOSIS — Z1159 Encounter for screening for other viral diseases: Secondary | ICD-10-CM | POA: Diagnosis not present

## 2016-12-28 DIAGNOSIS — E785 Hyperlipidemia, unspecified: Secondary | ICD-10-CM | POA: Diagnosis not present

## 2016-12-28 DIAGNOSIS — E039 Hypothyroidism, unspecified: Secondary | ICD-10-CM | POA: Diagnosis not present

## 2017-01-01 DIAGNOSIS — R5383 Other fatigue: Secondary | ICD-10-CM | POA: Diagnosis not present

## 2017-01-01 DIAGNOSIS — E785 Hyperlipidemia, unspecified: Secondary | ICD-10-CM | POA: Diagnosis not present

## 2017-01-01 DIAGNOSIS — R799 Abnormal finding of blood chemistry, unspecified: Secondary | ICD-10-CM | POA: Diagnosis not present

## 2017-01-05 DIAGNOSIS — M545 Low back pain: Secondary | ICD-10-CM | POA: Diagnosis not present

## 2017-01-05 DIAGNOSIS — S83242A Other tear of medial meniscus, current injury, left knee, initial encounter: Secondary | ICD-10-CM | POA: Diagnosis not present

## 2017-01-05 DIAGNOSIS — M25552 Pain in left hip: Secondary | ICD-10-CM | POA: Diagnosis not present

## 2017-01-27 DIAGNOSIS — F4310 Post-traumatic stress disorder, unspecified: Secondary | ICD-10-CM | POA: Diagnosis not present

## 2017-01-27 DIAGNOSIS — F332 Major depressive disorder, recurrent severe without psychotic features: Secondary | ICD-10-CM | POA: Diagnosis not present

## 2017-01-27 DIAGNOSIS — F3130 Bipolar disorder, current episode depressed, mild or moderate severity, unspecified: Secondary | ICD-10-CM | POA: Diagnosis not present

## 2017-01-27 DIAGNOSIS — F4323 Adjustment disorder with mixed anxiety and depressed mood: Secondary | ICD-10-CM | POA: Diagnosis not present

## 2017-02-01 DIAGNOSIS — F419 Anxiety disorder, unspecified: Secondary | ICD-10-CM | POA: Insufficient documentation

## 2017-02-01 DIAGNOSIS — E039 Hypothyroidism, unspecified: Secondary | ICD-10-CM | POA: Insufficient documentation

## 2017-02-09 DIAGNOSIS — M722 Plantar fascial fibromatosis: Secondary | ICD-10-CM | POA: Diagnosis not present

## 2017-02-12 ENCOUNTER — Encounter: Payer: Self-pay | Admitting: Internal Medicine

## 2017-02-12 ENCOUNTER — Ambulatory Visit (INDEPENDENT_AMBULATORY_CARE_PROVIDER_SITE_OTHER): Payer: Federal, State, Local not specified - PPO | Admitting: Internal Medicine

## 2017-02-12 VITALS — BP 132/84 | HR 73 | Ht 66.0 in | Wt 172.6 lb

## 2017-02-12 DIAGNOSIS — R079 Chest pain, unspecified: Secondary | ICD-10-CM

## 2017-02-12 NOTE — Progress Notes (Signed)
Pt referred by Dr Lysbeth GalasNyland for CP  HPI Patient is a 56 yo with history of hypothyroidism, anxiety/depression Referred for evaluation of CP  I saw her in 2015 for SOB with exertion along with gnawing sensation in chst   Echo normal with gr I diastolic dysfunciton  Stress echo normal    [place] [facility] [place] [facility]   The day she  went in she was  helping take care of parents   Got home  Discomfort   Started      Sent home   F/U with Dr Evonnie DawesNiland     Since then she says she knows discomfort iis there  Daily  Through day  Can feel gnawing while sitting in clinic here   Physical activity does not make it worse  Not pleuritici  Dailly    Patient notes that she is under increased stress No Known Allergies  Current Outpatient Medications  Medication Sig Dispense Refill  . AXERT 12.5 MG tablet Take 1 tablet by mouth daily as needed.    . clonazePAM (KLONOPIN) 0.5 MG tablet Take 1 tablet by mouth 2 (two) times daily as needed. Reported on 09/19/2015    . esomeprazole (NEXIUM) 40 MG capsule Take 40 mg by mouth daily before breakfast. Reported on 09/19/2015    . estradiol (ESTRACE) 0.5 MG tablet Take 0.5 mg by mouth daily.    Marland Kitchen. gabapentin (NEURONTIN) 300 MG capsule Take 300 mg by mouth daily as needed. Anxiety Attacks    . lamoTRIgine (LAMICTAL) 100 MG tablet Take 100 mg by mouth daily.     Marland Kitchen. levothyroxine (SYNTHROID, LEVOTHROID) 112 MCG tablet Take 112 mcg by mouth every other day. Alternate with 125 mg    . levothyroxine (SYNTHROID, LEVOTHROID) 125 MCG tablet Take 125 mcg by mouth every other day. Alternate with 112 mcg    . Psyllium (METAMUCIL PO) Take 15 mLs by mouth daily as needed (constipation). Reported on 09/19/2015    . Vortioxetine HBr (BRINTELLIX) 10 MG TABS Take 1 tablet by mouth daily.      No current facility-administered medications for this visit.     Past Medical History:  Diagnosis Date  . Anxiety   . Arthritis   . Depression   .  Hypothyroid     Past Surgical History:  Procedure Laterality Date  . CHOLECYSTECTOMY  11/13/2011   Procedure: LAPAROSCOPIC CHOLECYSTECTOMY;  Surgeon: Dalia HeadingMark A Jenkins, MD;  Location: [facility];  Service: General;  Laterality: N/A;  . ESOPHAGOGASTRODUODENOSCOPY  11/09/2011   Procedure: ESOPHAGOGASTRODUODENOSCOPY (EGD);  Surgeon: Malissa HippoNajeeb U Rehman, MD;  [facility];  Service: Endoscopy;  Laterality: N/A;  730  . PARTIAL HYSTERECTOMY     2011  . TUBAL LIGATION     20 yrs ago    Family History  Problem Relation Age of Onset  . Arthritis Mother   . Stroke Father   . Hypertension Father   . Diabetes Father   . Alcohol abuse Father   . Healthy Son   . Healthy Son   . Bipolar disorder Son   . ADD / ADHD Son   . Healthy Daughter   . Depression Sister   . Anxiety disorder Sister   . Depression Brother   . Depression Paternal Aunt     Social History   Socioeconomic History  . Marital status: Married    Spouse name: Not on file  . Number of children: Not on file  .  Years of education: Not on file  . Highest education level: Not on file  Social Needs  . Financial resource strain: Not on file  . Food insecurity - worry: Not on file  . Food insecurity - inability: Not on file  . Transportation needs - medical: Not on file  . Transportation needs - non-medical: Not on file  Occupational History  . Not on file  Tobacco Use  . Smoking status: Former Smoker    Types: Cigarettes    Last attempt to quit: 10/26/2007    Years since quitting: 9.3  . Smokeless tobacco: Never Used  Substance and Sexual Activity  . Alcohol use: Yes    Alcohol/week: 1.8 oz    Types: 3 Glasses of wine per week    Comment: on occassion  . Drug use: No  . Sexual activity: Yes    Birth control/protection: None  Other Topics Concern  . Not on file  Social History Narrative  . Not on file    Review of Systems:  All systems reviewed.  They are negative to the above problem except as previously  stated.  Vital Signs: BP 132/84   Pulse 73   Ht 5\' 6"  (1.676 m)   Wt 172 lb 9.6 oz (78.3 kg)   BMI 27.86 kg/m   Physical Exam  HEENT:  Normocephalic, atraumatic. EOMI, PERRLA.   Neck: JVP is normal.  No bruits.   Lungs: clear to auscultation. No rales no wheezes.   Heart: Regular rate and rhythm. Normal S1, S2. No S3.   No significant murmurs. PMI not displaced.   Abdomen:  Supple, nontender. Normal bowel sounds. No masses. No hepatomegaly.   Extremities:   Good distal pulses throughout. No lower extremity edema.   Musculoskeletal :moving all extremities.   Neuro:   alert and oriented x3.  CN II-XII grossly intact.  EKG  73  SR  Nonspecific ST changes    1.  Chest presure.  I do not think cardiac in origin  Aypical  ? GI Not associated with activity    I reassured her  Encouraged her to stay active     F/U as needd

## 2017-02-12 NOTE — Patient Instructions (Signed)
Your physician recommends that you continue on your current medications as directed. Please refer to the Current Medication list given to you today. Your physician recommends that you schedule a follow-up appointment as needed with Dr. Ross.   

## 2017-03-18 DIAGNOSIS — F4322 Adjustment disorder with anxiety: Secondary | ICD-10-CM | POA: Diagnosis not present

## 2017-04-05 DIAGNOSIS — R1310 Dysphagia, unspecified: Secondary | ICD-10-CM | POA: Diagnosis not present

## 2017-04-05 DIAGNOSIS — K219 Gastro-esophageal reflux disease without esophagitis: Secondary | ICD-10-CM | POA: Diagnosis not present

## 2017-04-05 DIAGNOSIS — E039 Hypothyroidism, unspecified: Secondary | ICD-10-CM | POA: Diagnosis not present

## 2017-04-05 DIAGNOSIS — J069 Acute upper respiratory infection, unspecified: Secondary | ICD-10-CM | POA: Diagnosis not present

## 2017-06-10 DIAGNOSIS — E039 Hypothyroidism, unspecified: Secondary | ICD-10-CM | POA: Diagnosis not present

## 2017-06-10 DIAGNOSIS — K219 Gastro-esophageal reflux disease without esophagitis: Secondary | ICD-10-CM | POA: Diagnosis not present

## 2017-06-14 DIAGNOSIS — E785 Hyperlipidemia, unspecified: Secondary | ICD-10-CM | POA: Diagnosis not present

## 2017-06-14 DIAGNOSIS — R799 Abnormal finding of blood chemistry, unspecified: Secondary | ICD-10-CM | POA: Diagnosis not present

## 2017-06-14 DIAGNOSIS — Z1159 Encounter for screening for other viral diseases: Secondary | ICD-10-CM | POA: Diagnosis not present

## 2017-06-14 DIAGNOSIS — Z1211 Encounter for screening for malignant neoplasm of colon: Secondary | ICD-10-CM | POA: Diagnosis not present

## 2017-06-14 DIAGNOSIS — E039 Hypothyroidism, unspecified: Secondary | ICD-10-CM | POA: Diagnosis not present

## 2017-06-21 ENCOUNTER — Encounter (INDEPENDENT_AMBULATORY_CARE_PROVIDER_SITE_OTHER): Payer: Self-pay | Admitting: Internal Medicine

## 2017-06-21 ENCOUNTER — Ambulatory Visit (INDEPENDENT_AMBULATORY_CARE_PROVIDER_SITE_OTHER): Payer: Federal, State, Local not specified - PPO | Admitting: Internal Medicine

## 2017-06-21 VITALS — BP 90/68 | HR 64 | Temp 98.6°F | Ht 66.0 in | Wt 166.7 lb

## 2017-06-21 DIAGNOSIS — R05 Cough: Secondary | ICD-10-CM | POA: Diagnosis not present

## 2017-06-21 DIAGNOSIS — K219 Gastro-esophageal reflux disease without esophagitis: Secondary | ICD-10-CM

## 2017-06-21 DIAGNOSIS — R059 Cough, unspecified: Secondary | ICD-10-CM

## 2017-06-21 MED ORDER — AMOXICILLIN-POT CLAVULANATE 875-125 MG PO TABS: 1.0000 | ORAL_TABLET | Freq: Two times a day (BID) | ORAL | 0 refills | Status: DC

## 2017-06-21 NOTE — Progress Notes (Addendum)
   Subjective:    Patient ID: Ariel Ramirez, female    DOB: 1960-08-11, 57 y.o.   MRN: [id number]  HPI Referred by Dr Lysbeth GalasNyland for GERD. She says she has a dry cough.  She says she can speak but has hoarseness.  hoarse.  She says a couple of months she had acid reflux. Saw Dr. Lysbeth GalasNyland about a week ago and was placed on Protonix.  She says she is still getting a tickle in her throat.  Her appetite is okay. She does not feel good.  Has a BM every other day.  Underwent an EGD in 2013 which revealed a small sliding hiatal hernia,other wise normal EGD. Underwent a laparoscopic cholecystectomy in 2013 for chronic cholecystectomy    Review of Systems Past Medical History:  Diagnosis Date  . Anxiety   . Arthritis   . Depression   . Hypothyroid     Past Surgical History:  Procedure Laterality Date  . CHOLECYSTECTOMY  11/13/2011   Procedure: LAPAROSCOPIC CHOLECYSTECTOMY;  Surgeon: Dalia HeadingMark A Jenkins, MD;  Location: [facility];  Service: General;  Laterality: N/A;  . ESOPHAGOGASTRODUODENOSCOPY  11/09/2011   Procedure: ESOPHAGOGASTRODUODENOSCOPY (EGD);  Surgeon: Malissa HippoNajeeb U Rehman, MD;  Location: [facility];  Service: Endoscopy;  Laterality: N/A;  730  . PARTIAL HYSTERECTOMY     2011  . TUBAL LIGATION     20 yrs ago    No Known Allergies  Current Outpatient Medications on File Prior to Visit  Medication Sig Dispense Refill  . clonazePAM (KLONOPIN) 0.5 MG tablet Take 1 tablet by mouth 2 (two) times daily as needed. Reported on 09/19/2015    . estradiol (ESTRACE) 0.5 MG tablet Take 0.5 mg by mouth daily.    Marland Kitchen. gabapentin (NEURONTIN) 300 MG capsule Take 300 mg by mouth 2 (two) times daily. Anxiety Attacks     . lamoTRIgine (LAMICTAL) 100 MG tablet Take 100 mg by mouth daily.     Marland Kitchen. levothyroxine (SYNTHROID, LEVOTHROID) 112 MCG tablet Take 112 mcg by mouth every other day. Alternate with 125 mg    . levothyroxine (SYNTHROID, LEVOTHROID) 125 MCG tablet Take 125 mcg by mouth every other day. Alternate with  112 mcg    . pantoprazole (PROTONIX) 40 MG tablet Take 40 mg by mouth daily.    Marland Kitchen. vortioxetine HBr (TRINTELLIX) 10 MG TABS tablet Take 10 mg by mouth daily.     No current facility-administered medications on file prior to visit.         Objective:   Physical Exam Blood pressure 90/68, pulse 64, temperature 98.6 F (37 C), height 5\' 6"  (1.676 m), weight 166 lb 11.2 oz (75.6 kg).  Alert and oriented. Skin warm and dry. Oral mucosa is moist.   . Sclera anicteric, conjunctivae is pink. Thyroid not enlarged. No cervical lymphadenopathy. Lungs clear. Heart regular rate and rhythm.  Abdomen is soft. Bowel sounds are positive. No hepatomegaly. No abdominal masses felt. No tenderness.  No edema to lower extremities.          Assessment & Plan:  GERD. Am going to try her on Dexilant x 30 days. She will let me know how she does. Cough,hoarseness: Suspect most of her symptoims from a viral infection. Will try Augmentin BID x 10 days and see how she does. She will call with a PR in 4 weeks.

## 2017-06-21 NOTE — Patient Instructions (Signed)
Rx for Augmentin sent to her pharmacy. Samples of Dexilant.

## 2017-07-08 ENCOUNTER — Ambulatory Visit (INDEPENDENT_AMBULATORY_CARE_PROVIDER_SITE_OTHER): Payer: Self-pay | Admitting: Internal Medicine

## 2017-07-16 DIAGNOSIS — E039 Hypothyroidism, unspecified: Secondary | ICD-10-CM | POA: Diagnosis not present

## 2017-07-16 DIAGNOSIS — Z1211 Encounter for screening for malignant neoplasm of colon: Secondary | ICD-10-CM | POA: Diagnosis not present

## 2017-07-16 DIAGNOSIS — K219 Gastro-esophageal reflux disease without esophagitis: Secondary | ICD-10-CM | POA: Diagnosis not present

## 2017-07-20 DIAGNOSIS — F4323 Adjustment disorder with mixed anxiety and depressed mood: Secondary | ICD-10-CM | POA: Diagnosis not present

## 2017-07-20 DIAGNOSIS — F3130 Bipolar disorder, current episode depressed, mild or moderate severity, unspecified: Secondary | ICD-10-CM | POA: Diagnosis not present

## 2017-07-20 DIAGNOSIS — F332 Major depressive disorder, recurrent severe without psychotic features: Secondary | ICD-10-CM | POA: Diagnosis not present

## 2017-07-20 DIAGNOSIS — F4310 Post-traumatic stress disorder, unspecified: Secondary | ICD-10-CM | POA: Diagnosis not present

## 2017-08-16 DIAGNOSIS — Z1231 Encounter for screening mammogram for malignant neoplasm of breast: Secondary | ICD-10-CM | POA: Diagnosis not present

## 2017-08-16 DIAGNOSIS — Z01419 Encounter for gynecological examination (general) (routine) without abnormal findings: Secondary | ICD-10-CM | POA: Diagnosis not present

## 2017-08-16 DIAGNOSIS — Z6827 Body mass index (BMI) 27.0-27.9, adult: Secondary | ICD-10-CM | POA: Diagnosis not present

## 2017-08-19 DIAGNOSIS — F332 Major depressive disorder, recurrent severe without psychotic features: Secondary | ICD-10-CM | POA: Diagnosis not present

## 2017-08-19 DIAGNOSIS — F4323 Adjustment disorder with mixed anxiety and depressed mood: Secondary | ICD-10-CM | POA: Diagnosis not present

## 2017-08-19 DIAGNOSIS — F4310 Post-traumatic stress disorder, unspecified: Secondary | ICD-10-CM | POA: Diagnosis not present

## 2017-08-19 DIAGNOSIS — F3130 Bipolar disorder, current episode depressed, mild or moderate severity, unspecified: Secondary | ICD-10-CM | POA: Diagnosis not present

## 2017-09-21 DIAGNOSIS — G43009 Migraine without aura, not intractable, without status migrainosus: Secondary | ICD-10-CM | POA: Diagnosis not present

## 2017-09-21 DIAGNOSIS — R51 Headache: Secondary | ICD-10-CM | POA: Diagnosis not present

## 2017-09-21 DIAGNOSIS — G43809 Other migraine, not intractable, without status migrainosus: Secondary | ICD-10-CM | POA: Diagnosis not present

## 2017-09-22 DIAGNOSIS — R102 Pelvic and perineal pain: Secondary | ICD-10-CM | POA: Diagnosis not present

## 2017-10-11 DIAGNOSIS — K080 Exfoliation of teeth due to systemic causes: Secondary | ICD-10-CM | POA: Diagnosis not present

## 2017-10-19 DIAGNOSIS — F3130 Bipolar disorder, current episode depressed, mild or moderate severity, unspecified: Secondary | ICD-10-CM | POA: Diagnosis not present

## 2017-10-19 DIAGNOSIS — F411 Generalized anxiety disorder: Secondary | ICD-10-CM | POA: Diagnosis not present

## 2017-10-19 DIAGNOSIS — F332 Major depressive disorder, recurrent severe without psychotic features: Secondary | ICD-10-CM | POA: Diagnosis not present

## 2017-10-19 DIAGNOSIS — F4310 Post-traumatic stress disorder, unspecified: Secondary | ICD-10-CM | POA: Diagnosis not present

## 2017-11-30 DIAGNOSIS — F411 Generalized anxiety disorder: Secondary | ICD-10-CM | POA: Diagnosis not present

## 2017-11-30 DIAGNOSIS — F3130 Bipolar disorder, current episode depressed, mild or moderate severity, unspecified: Secondary | ICD-10-CM | POA: Diagnosis not present

## 2017-11-30 DIAGNOSIS — F4310 Post-traumatic stress disorder, unspecified: Secondary | ICD-10-CM | POA: Diagnosis not present

## 2017-11-30 DIAGNOSIS — F332 Major depressive disorder, recurrent severe without psychotic features: Secondary | ICD-10-CM | POA: Diagnosis not present

## 2017-12-07 DIAGNOSIS — F411 Generalized anxiety disorder: Secondary | ICD-10-CM | POA: Diagnosis not present

## 2017-12-07 DIAGNOSIS — F4310 Post-traumatic stress disorder, unspecified: Secondary | ICD-10-CM | POA: Diagnosis not present

## 2017-12-07 DIAGNOSIS — F332 Major depressive disorder, recurrent severe without psychotic features: Secondary | ICD-10-CM | POA: Diagnosis not present

## 2017-12-07 DIAGNOSIS — F3130 Bipolar disorder, current episode depressed, mild or moderate severity, unspecified: Secondary | ICD-10-CM | POA: Diagnosis not present

## 2017-12-21 DIAGNOSIS — F3130 Bipolar disorder, current episode depressed, mild or moderate severity, unspecified: Secondary | ICD-10-CM | POA: Diagnosis not present

## 2017-12-21 DIAGNOSIS — F411 Generalized anxiety disorder: Secondary | ICD-10-CM | POA: Diagnosis not present

## 2017-12-21 DIAGNOSIS — F4310 Post-traumatic stress disorder, unspecified: Secondary | ICD-10-CM | POA: Diagnosis not present

## 2017-12-21 DIAGNOSIS — F332 Major depressive disorder, recurrent severe without psychotic features: Secondary | ICD-10-CM | POA: Diagnosis not present

## 2017-12-28 DIAGNOSIS — F411 Generalized anxiety disorder: Secondary | ICD-10-CM | POA: Diagnosis not present

## 2017-12-28 DIAGNOSIS — F3130 Bipolar disorder, current episode depressed, mild or moderate severity, unspecified: Secondary | ICD-10-CM | POA: Diagnosis not present

## 2017-12-28 DIAGNOSIS — F332 Major depressive disorder, recurrent severe without psychotic features: Secondary | ICD-10-CM | POA: Diagnosis not present

## 2017-12-28 DIAGNOSIS — F4310 Post-traumatic stress disorder, unspecified: Secondary | ICD-10-CM | POA: Diagnosis not present

## 2018-01-10 DIAGNOSIS — M5020 Other cervical disc displacement, unspecified cervical region: Secondary | ICD-10-CM | POA: Diagnosis not present

## 2018-01-10 DIAGNOSIS — G43009 Migraine without aura, not intractable, without status migrainosus: Secondary | ICD-10-CM | POA: Diagnosis not present

## 2018-01-10 DIAGNOSIS — M542 Cervicalgia: Secondary | ICD-10-CM | POA: Diagnosis not present

## 2018-01-10 DIAGNOSIS — R51 Headache: Secondary | ICD-10-CM | POA: Diagnosis not present

## 2018-01-11 DIAGNOSIS — F3130 Bipolar disorder, current episode depressed, mild or moderate severity, unspecified: Secondary | ICD-10-CM | POA: Diagnosis not present

## 2018-01-11 DIAGNOSIS — F4310 Post-traumatic stress disorder, unspecified: Secondary | ICD-10-CM | POA: Diagnosis not present

## 2018-01-11 DIAGNOSIS — F332 Major depressive disorder, recurrent severe without psychotic features: Secondary | ICD-10-CM | POA: Diagnosis not present

## 2018-01-11 DIAGNOSIS — F411 Generalized anxiety disorder: Secondary | ICD-10-CM | POA: Diagnosis not present

## 2018-01-17 DIAGNOSIS — E039 Hypothyroidism, unspecified: Secondary | ICD-10-CM | POA: Diagnosis not present

## 2018-01-17 DIAGNOSIS — E785 Hyperlipidemia, unspecified: Secondary | ICD-10-CM | POA: Diagnosis not present

## 2018-01-17 DIAGNOSIS — K219 Gastro-esophageal reflux disease without esophagitis: Secondary | ICD-10-CM | POA: Diagnosis not present

## 2018-01-17 DIAGNOSIS — Z23 Encounter for immunization: Secondary | ICD-10-CM | POA: Diagnosis not present

## 2018-01-18 DIAGNOSIS — K219 Gastro-esophageal reflux disease without esophagitis: Secondary | ICD-10-CM | POA: Diagnosis not present

## 2018-01-18 DIAGNOSIS — E785 Hyperlipidemia, unspecified: Secondary | ICD-10-CM | POA: Diagnosis not present

## 2018-01-18 DIAGNOSIS — E039 Hypothyroidism, unspecified: Secondary | ICD-10-CM | POA: Diagnosis not present

## 2018-01-20 DIAGNOSIS — M5020 Other cervical disc displacement, unspecified cervical region: Secondary | ICD-10-CM | POA: Diagnosis not present

## 2018-01-20 DIAGNOSIS — M542 Cervicalgia: Secondary | ICD-10-CM | POA: Diagnosis not present

## 2018-01-25 DIAGNOSIS — M542 Cervicalgia: Secondary | ICD-10-CM | POA: Diagnosis not present

## 2018-01-25 DIAGNOSIS — M5412 Radiculopathy, cervical region: Secondary | ICD-10-CM | POA: Diagnosis not present

## 2018-02-07 DIAGNOSIS — F332 Major depressive disorder, recurrent severe without psychotic features: Secondary | ICD-10-CM | POA: Diagnosis not present

## 2018-02-07 DIAGNOSIS — F411 Generalized anxiety disorder: Secondary | ICD-10-CM | POA: Diagnosis not present

## 2018-02-07 DIAGNOSIS — F3130 Bipolar disorder, current episode depressed, mild or moderate severity, unspecified: Secondary | ICD-10-CM | POA: Diagnosis not present

## 2018-02-07 DIAGNOSIS — F4310 Post-traumatic stress disorder, unspecified: Secondary | ICD-10-CM | POA: Diagnosis not present

## 2018-02-15 DIAGNOSIS — N951 Menopausal and female climacteric states: Secondary | ICD-10-CM | POA: Diagnosis not present

## 2018-02-15 DIAGNOSIS — E039 Hypothyroidism, unspecified: Secondary | ICD-10-CM | POA: Diagnosis not present

## 2018-02-15 DIAGNOSIS — E785 Hyperlipidemia, unspecified: Secondary | ICD-10-CM | POA: Diagnosis not present

## 2018-03-01 DIAGNOSIS — F332 Major depressive disorder, recurrent severe without psychotic features: Secondary | ICD-10-CM | POA: Diagnosis not present

## 2018-03-01 DIAGNOSIS — F3130 Bipolar disorder, current episode depressed, mild or moderate severity, unspecified: Secondary | ICD-10-CM | POA: Diagnosis not present

## 2018-03-01 DIAGNOSIS — F4310 Post-traumatic stress disorder, unspecified: Secondary | ICD-10-CM | POA: Diagnosis not present

## 2018-03-01 DIAGNOSIS — F411 Generalized anxiety disorder: Secondary | ICD-10-CM | POA: Diagnosis not present

## 2018-03-11 DIAGNOSIS — R51 Headache: Secondary | ICD-10-CM | POA: Diagnosis not present

## 2018-03-11 DIAGNOSIS — M50223 Other cervical disc displacement at C6-C7 level: Secondary | ICD-10-CM | POA: Diagnosis not present

## 2018-03-11 DIAGNOSIS — M542 Cervicalgia: Secondary | ICD-10-CM | POA: Diagnosis not present

## 2018-03-11 DIAGNOSIS — G43009 Migraine without aura, not intractable, without status migrainosus: Secondary | ICD-10-CM | POA: Diagnosis not present

## 2018-03-29 DIAGNOSIS — R6889 Other general symptoms and signs: Secondary | ICD-10-CM | POA: Diagnosis not present

## 2018-03-29 DIAGNOSIS — J0100 Acute maxillary sinusitis, unspecified: Secondary | ICD-10-CM | POA: Diagnosis not present

## 2018-04-25 DIAGNOSIS — F4310 Post-traumatic stress disorder, unspecified: Secondary | ICD-10-CM | POA: Diagnosis not present

## 2018-04-25 DIAGNOSIS — F3130 Bipolar disorder, current episode depressed, mild or moderate severity, unspecified: Secondary | ICD-10-CM | POA: Diagnosis not present

## 2018-04-25 DIAGNOSIS — F332 Major depressive disorder, recurrent severe without psychotic features: Secondary | ICD-10-CM | POA: Diagnosis not present

## 2018-04-25 DIAGNOSIS — F411 Generalized anxiety disorder: Secondary | ICD-10-CM | POA: Diagnosis not present

## 2018-05-09 DIAGNOSIS — F4310 Post-traumatic stress disorder, unspecified: Secondary | ICD-10-CM | POA: Diagnosis not present

## 2018-05-09 DIAGNOSIS — F332 Major depressive disorder, recurrent severe without psychotic features: Secondary | ICD-10-CM | POA: Diagnosis not present

## 2018-05-09 DIAGNOSIS — F3130 Bipolar disorder, current episode depressed, mild or moderate severity, unspecified: Secondary | ICD-10-CM | POA: Diagnosis not present

## 2018-05-09 DIAGNOSIS — J069 Acute upper respiratory infection, unspecified: Secondary | ICD-10-CM | POA: Diagnosis not present

## 2018-05-09 DIAGNOSIS — F411 Generalized anxiety disorder: Secondary | ICD-10-CM | POA: Diagnosis not present

## 2018-05-24 DIAGNOSIS — R1310 Dysphagia, unspecified: Secondary | ICD-10-CM | POA: Diagnosis not present

## 2018-05-24 DIAGNOSIS — K5900 Constipation, unspecified: Secondary | ICD-10-CM | POA: Diagnosis not present

## 2018-05-24 DIAGNOSIS — K219 Gastro-esophageal reflux disease without esophagitis: Secondary | ICD-10-CM | POA: Diagnosis not present

## 2018-05-26 DIAGNOSIS — K2950 Unspecified chronic gastritis without bleeding: Secondary | ICD-10-CM | POA: Diagnosis not present

## 2018-05-26 DIAGNOSIS — K449 Diaphragmatic hernia without obstruction or gangrene: Secondary | ICD-10-CM | POA: Diagnosis not present

## 2018-05-26 DIAGNOSIS — K3189 Other diseases of stomach and duodenum: Secondary | ICD-10-CM | POA: Diagnosis not present

## 2018-05-26 DIAGNOSIS — K219 Gastro-esophageal reflux disease without esophagitis: Secondary | ICD-10-CM | POA: Diagnosis not present

## 2018-05-26 DIAGNOSIS — R1310 Dysphagia, unspecified: Secondary | ICD-10-CM | POA: Diagnosis not present

## 2018-07-25 DIAGNOSIS — K5900 Constipation, unspecified: Secondary | ICD-10-CM | POA: Diagnosis not present

## 2018-07-25 DIAGNOSIS — K219 Gastro-esophageal reflux disease without esophagitis: Secondary | ICD-10-CM | POA: Diagnosis not present

## 2018-07-25 DIAGNOSIS — R1310 Dysphagia, unspecified: Secondary | ICD-10-CM | POA: Diagnosis not present

## 2018-07-27 DIAGNOSIS — F332 Major depressive disorder, recurrent severe without psychotic features: Secondary | ICD-10-CM | POA: Diagnosis not present

## 2018-07-27 DIAGNOSIS — F411 Generalized anxiety disorder: Secondary | ICD-10-CM | POA: Diagnosis not present

## 2018-07-27 DIAGNOSIS — F303 Manic episode in partial remission: Secondary | ICD-10-CM | POA: Diagnosis not present

## 2018-07-27 DIAGNOSIS — F4310 Post-traumatic stress disorder, unspecified: Secondary | ICD-10-CM | POA: Diagnosis not present

## 2018-07-29 DIAGNOSIS — K219 Gastro-esophageal reflux disease without esophagitis: Secondary | ICD-10-CM | POA: Diagnosis not present

## 2018-07-29 DIAGNOSIS — R1310 Dysphagia, unspecified: Secondary | ICD-10-CM | POA: Diagnosis not present

## 2018-07-29 DIAGNOSIS — Z1159 Encounter for screening for other viral diseases: Secondary | ICD-10-CM | POA: Diagnosis not present

## 2018-07-29 DIAGNOSIS — Z01818 Encounter for other preprocedural examination: Secondary | ICD-10-CM | POA: Diagnosis not present

## 2018-07-29 DIAGNOSIS — E039 Hypothyroidism, unspecified: Secondary | ICD-10-CM | POA: Diagnosis not present

## 2018-07-29 DIAGNOSIS — F329 Major depressive disorder, single episode, unspecified: Secondary | ICD-10-CM | POA: Diagnosis not present

## 2018-08-01 DIAGNOSIS — E039 Hypothyroidism, unspecified: Secondary | ICD-10-CM | POA: Diagnosis not present

## 2018-08-01 DIAGNOSIS — E785 Hyperlipidemia, unspecified: Secondary | ICD-10-CM | POA: Diagnosis not present

## 2018-08-01 DIAGNOSIS — K219 Gastro-esophageal reflux disease without esophagitis: Secondary | ICD-10-CM | POA: Diagnosis not present

## 2018-08-01 DIAGNOSIS — Z79899 Other long term (current) drug therapy: Secondary | ICD-10-CM | POA: Diagnosis not present

## 2018-08-01 DIAGNOSIS — F319 Bipolar disorder, unspecified: Secondary | ICD-10-CM | POA: Diagnosis not present

## 2018-08-01 DIAGNOSIS — R1310 Dysphagia, unspecified: Secondary | ICD-10-CM | POA: Diagnosis not present

## 2018-08-05 DIAGNOSIS — R1310 Dysphagia, unspecified: Secondary | ICD-10-CM | POA: Diagnosis not present

## 2018-08-12 DIAGNOSIS — K7689 Other specified diseases of liver: Secondary | ICD-10-CM | POA: Diagnosis not present

## 2018-08-12 DIAGNOSIS — Z79899 Other long term (current) drug therapy: Secondary | ICD-10-CM | POA: Diagnosis not present

## 2018-08-12 DIAGNOSIS — E079 Disorder of thyroid, unspecified: Secondary | ICD-10-CM | POA: Diagnosis not present

## 2018-08-12 DIAGNOSIS — K5730 Diverticulosis of large intestine without perforation or abscess without bleeding: Secondary | ICD-10-CM | POA: Diagnosis not present

## 2018-08-12 DIAGNOSIS — R7989 Other specified abnormal findings of blood chemistry: Secondary | ICD-10-CM | POA: Diagnosis not present

## 2018-08-12 DIAGNOSIS — F329 Major depressive disorder, single episode, unspecified: Secondary | ICD-10-CM | POA: Diagnosis not present

## 2018-08-12 DIAGNOSIS — K5900 Constipation, unspecified: Secondary | ICD-10-CM | POA: Diagnosis not present

## 2018-08-12 DIAGNOSIS — K219 Gastro-esophageal reflux disease without esophagitis: Secondary | ICD-10-CM | POA: Diagnosis not present

## 2018-08-12 DIAGNOSIS — K6389 Other specified diseases of intestine: Secondary | ICD-10-CM | POA: Diagnosis not present

## 2018-08-12 DIAGNOSIS — R1010 Upper abdominal pain, unspecified: Secondary | ICD-10-CM | POA: Diagnosis not present

## 2018-08-12 DIAGNOSIS — R109 Unspecified abdominal pain: Secondary | ICD-10-CM | POA: Diagnosis not present

## 2018-08-12 DIAGNOSIS — R140 Abdominal distension (gaseous): Secondary | ICD-10-CM | POA: Diagnosis not present

## 2018-08-12 DIAGNOSIS — F419 Anxiety disorder, unspecified: Secondary | ICD-10-CM | POA: Diagnosis not present

## 2018-08-17 DIAGNOSIS — F411 Generalized anxiety disorder: Secondary | ICD-10-CM | POA: Diagnosis not present

## 2018-08-17 DIAGNOSIS — F332 Major depressive disorder, recurrent severe without psychotic features: Secondary | ICD-10-CM | POA: Diagnosis not present

## 2018-08-17 DIAGNOSIS — F303 Manic episode in partial remission: Secondary | ICD-10-CM | POA: Diagnosis not present

## 2018-08-17 DIAGNOSIS — F4310 Post-traumatic stress disorder, unspecified: Secondary | ICD-10-CM | POA: Diagnosis not present

## 2018-08-22 DIAGNOSIS — K5900 Constipation, unspecified: Secondary | ICD-10-CM | POA: Diagnosis not present

## 2018-08-22 DIAGNOSIS — K219 Gastro-esophageal reflux disease without esophagitis: Secondary | ICD-10-CM | POA: Diagnosis not present

## 2018-08-22 DIAGNOSIS — R1013 Epigastric pain: Secondary | ICD-10-CM | POA: Diagnosis not present

## 2018-08-22 DIAGNOSIS — R140 Abdominal distension (gaseous): Secondary | ICD-10-CM | POA: Diagnosis not present

## 2018-08-28 DIAGNOSIS — H60392 Other infective otitis externa, left ear: Secondary | ICD-10-CM | POA: Diagnosis not present

## 2018-09-08 ENCOUNTER — Encounter: Payer: Self-pay | Admitting: Physician Assistant

## 2018-09-08 ENCOUNTER — Ambulatory Visit: Payer: Federal, State, Local not specified - PPO | Admitting: Physician Assistant

## 2018-09-08 ENCOUNTER — Other Ambulatory Visit: Payer: Self-pay

## 2018-09-08 VITALS — BP 120/80 | HR 65 | Ht 66.0 in | Wt 169.0 lb

## 2018-09-08 DIAGNOSIS — R0609 Other forms of dyspnea: Secondary | ICD-10-CM | POA: Diagnosis not present

## 2018-09-08 DIAGNOSIS — R0789 Other chest pain: Secondary | ICD-10-CM

## 2018-09-08 NOTE — Progress Notes (Signed)
Cardiology Office Note   Date:  09/08/2018   ID:  Ariel Ramirez, DOB 09/30/1960, MRN [id number]  PCP:  Joette CatchingNyland, Leonard, MD Cardiologist:  Dietrich PatesPaula Ross, MD 02/12/2017 Theodore Demarkhonda Keshun Berrett, PA-C   No chief complaint on file.   History of Present Illness: Ariel Ramirez is a 58 y.o. female with a history of hypothyroid, OA, GERD, anxiety/depression, chest pain 2015 s/p echo & stress echo>>ok. Chest pain 2018 >>no further eval indicated    Ariel Ramirez presents for cardiology evaluation.  About a month ago, she started having chest pain and SOB.   First, she had problems swallowing, 05/2018 EGD w/ dilatation>>ever since then, probs w/ epigastric and abd pain and bloating. Took a laxative w/ no improvement in symptoms. Sx worsened and she went to the [place] in [place]. They evaluated her w/ CT scan, all was ok.   Since then, sx have gradually improved, but she is concerned>>COVID negative>>cards referral.   The GERD pain is constant. It has not been a 0/10 since before she saw the GI MD. Tums, soda water were no help. She is trying to watch what she eats. No change with food. No change w/ position. It is a burning pain.   She also has CP, "gnawing", 3-4/10. It is constant, has been there for months. Started after the EGD. No rx tried, GI rx no help.   Has SOB at intervals, about 5 x day.  It occurs at rest and with exertion.  She feels that her exercise tolerance is decreased. Feels it started about a week after the EGD.  However, she went walking with family the other day and was able to walk about 15 minutes on flat ground.  She did not feel particularly short of breath at that time and it made no change in her chest pain.  Does not wake w/ LE edema, no orthopnea or PND.   Sx feel different from 2018, that is why she was concerned.   She admits to anxiety and has family stress from taking care of her mother with Alzheimer's.  She could really use a break from her mother.     Past Medical History:  Diagnosis Date  . Anxiety   . Arthritis   . Chest pain, atypical 2015   Echo and stress echo were okay  . Depression   . Hypothyroid     Past Surgical History:  Procedure Laterality Date  . CHOLECYSTECTOMY  11/13/2011   Procedure: LAPAROSCOPIC CHOLECYSTECTOMY;  Surgeon: Dalia HeadingMark A Jenkins, MD;  Location: [facility];  Service: General;  Laterality: N/A;  . ESOPHAGOGASTRODUODENOSCOPY  11/09/2011   Procedure: ESOPHAGOGASTRODUODENOSCOPY (EGD);  Surgeon: Malissa HippoNajeeb U Rehman, MD;  Location: [facility];  Service: Endoscopy;  Laterality: N/A;  730  . PARTIAL HYSTERECTOMY     2011  . TUBAL LIGATION     20 yrs ago    Current Outpatient Medications  Medication Sig Dispense Refill  . pantoprazole (PROTONIX) 40 MG tablet Take 40 mg by mouth daily.    Marland Kitchen. vortioxetine HBr (TRINTELLIX) 10 MG TABS tablet Take 10 mg by mouth daily.    . clonazePAM (KLONOPIN) 0.5 MG tablet Take 1 tablet by mouth 2 (two) times daily as needed. Reported on 09/19/2015    . estradiol (ESTRACE) 0.5 MG tablet Take 0.5 mg by mouth daily.    Marland Kitchen. gabapentin (NEURONTIN) 300 MG capsule Take 300 mg by mouth 2 (two) times daily. Anxiety Attacks     . lamoTRIgine (LAMICTAL) 100  MG tablet Take 100 mg by mouth 2 (two) times daily.     Marland Kitchen. levothyroxine (SYNTHROID, LEVOTHROID) 112 MCG tablet Take 112 mcg by mouth every other day. Alternate with 125 mg    . levothyroxine (SYNTHROID, LEVOTHROID) 125 MCG tablet Take 125 mcg by mouth every other day. Alternate with 112 mcg     No current facility-administered medications for this visit.     Allergies:   Patient has no known allergies.    Social History:  The patient  reports that she quit smoking about 10 years ago. Her smoking use included cigarettes. She has never used smokeless tobacco. She reports current alcohol use of about 3.0 standard drinks of alcohol per week. She reports that she does not use drugs.   Family History:  The patient's family history includes ADD /  ADHD in her son; Alcohol abuse in her father; Anxiety disorder in her sister; Arthritis in her mother; Bipolar disorder in her son; Depression in her brother, paternal aunt, and sister; Diabetes in her father; Healthy in her daughter, son, and son; Hypertension in her father; Stroke in her father.  She indicated that her mother is alive. She indicated that her father is alive. She indicated that her sister is alive. She indicated that her brother is alive. She indicated that her daughter is alive. She indicated that both of her sons are alive. She indicated that her paternal aunt is alive.    ROS:  Please see the history of present illness. All other systems are reviewed and negative.    PHYSICAL EXAM: VS:  BP 120/80   Pulse 65   Ht 5\' 6"  (1.676 m)   Wt 169 lb (76.7 kg)   SpO2 98%   BMI 27.28 kg/m  , BMI Body mass index is 27.28 kg/m. GEN: Well nourished, well developed, female in no acute distress HEENT: normal for age  Neck: no JVD, no carotid bruit, no masses Cardiac: RRR; no murmur, no rubs, or gallops Respiratory:  clear to auscultation bilaterally, normal work of breathing GI: soft, nontender, nondistended, + BS MS: no deformity or atrophy; no edema; distal pulses are 2+ in all 4 extremities  Skin: warm and dry, no rash Neuro:  Strength and sensation are intact Psych: euthymic mood, full affect   EKG:  EKG is ordered today. The ekg ordered today demonstrates sinus rhythm, heart rate 61, minor T wave changes from an 18 ECG in the lateral leads, unclear significance  ECHO: 06/30/2013 Left ventricle: The cavity size was normal. Wall thickness  was normal. Systolic function was normal. The estimated  ejection fraction was in the range of 55% to 60%. Wall  motion was normal; there were no regional wall motion  abnormalities. Doppler parameters are consistent with  abnormal left ventricular relaxation (grade 1 diastolic  dysfunction).   Impressions:   - False chord at LV  apex.   STRESS ECHO: 06/30/2013 Study Conclusions   - Stress ECG conclusions: There were no stress arrhythmias  or conduction abnormalities. The stress ECG was normal.  - Staged echo: There was no echocardiographic evidence for  stress-induced ischemia.  Impressions:   - Stress echocardiogram with no chest pain; SBP decreased  from 184 in Stage 2 to 152 in Stage 3 consistent with  hypotensive response; no ST changes and no obvious  stress-induced wall motion abnormalities (note question  apical wall motion abnormality on initial saved images;  however on review of additional images, wall motion  appeared to be normal).  Clinical correlation advised given  BP response.    Recent Labs: No results found for requested labs within last 8760 hours.  CBC    Component Value Date/Time   WBC 4.0 04/02/2015 0921   RBC 4.23 04/02/2015 0921   HGB 12.8 04/02/2015 0921   HCT 37.5 04/02/2015 0921   PLT 203 04/02/2015 0921   MCV 88.7 04/02/2015 0921   MCH 30.3 04/02/2015 0921   MCHC 34.1 04/02/2015 0921   RDW 13.0 04/02/2015 0921   LYMPHSABS 2.5 03/03/2013 1216   MONOABS 0.4 03/03/2013 1216   EOSABS 0.1 03/03/2013 1216   BASOSABS 0.0 03/03/2013 1216   CMP Latest Ref Rng & Units 04/02/2015 03/03/2013 12/22/2012  Glucose 65 - 99 mg/dL 97 95 82  BUN 6 - 20 mg/dL 22(H) 9 15  Creatinine 0.44 - 1.00 mg/dL 0.84 0.70 0.78  Sodium 135 - 145 mmol/L 139 141 142  Potassium 3.5 - 5.1 mmol/L 4.3 3.9 4.4  Chloride 101 - 111 mmol/L 102 103 105  CO2 22 - 32 mmol/L 29 31 28   Calcium 8.9 - 10.3 mg/dL 9.5 9.0 9.2  Total Protein 6.5 - 8.1 g/dL 6.7 - 6.5  Total Bilirubin 0.3 - 1.2 mg/dL 0.6 - 0.4  Alkaline Phos 38 - 126 U/L 51 - 47  AST 15 - 41 U/L 23 - 13  ALT 14 - 54 U/L 18 - 12     Lipid Panel No results found for: CHOL, HDL, LDLCALC, LDLDIRECT, TRIG, CHOLHDL    Wt Readings from Last 3 Encounters:  09/08/18 169 lb (76.7 kg)  06/21/17 166 lb 11.2 oz (75.6 kg)  02/12/17 172 lb  9.6 oz (78.3 kg)     Other studies Reviewed: Additional studies/ records that were reviewed today include: Office notes and testing.  ASSESSMENT AND PLAN:  1.  Atypical chest pain: - She feels the symptoms are different from previous symptoms - They started after she was treated for esophageal stricture, with dilatation - She has not been back to the GI doctor for the symptoms. - I explained that the continuous nature of the symptoms for 3 months without acute ECG changes and with no change secondary to exertion made it unlikely that this is cardiac. - I explained that she should go back to the GI doctor and try to get her GI symptoms resolved. - If she continues to have symptoms once her GI symptoms are resolved, contact us  2.  Dyspnea on exertion: - This also occurs at rest and with exertion. - However, women have a history of atypical symptoms -Recommend an exercise treadmill Myoview. - If her EF is abnormal on Myoview, she may need an echo but will hold off on this for now  Plan: Order treadmill Myoview -I discussed this with the patient and she prefers to hold off and see if her symptoms improve once her GI symptoms are treated.  If she still has the dyspnea on exertion once the GI symptoms are treated, get the Myoview  Current medicines are reviewed at length with the patient today.  The patient does not have concerns regarding medicines.  The following changes have been made:  no change  Labs/ tests ordered today include:   Orders Placed This Encounter  Procedures  . EKG 12-Lead     Disposition:   FU with Dorris Carnes, MD  Signed, Rosaria Ferries, PA-C  09/08/2018 2:03 PM    [place] Phone: [phone]; Fax: [phone]

## 2018-09-08 NOTE — Patient Instructions (Addendum)
Medication Instructions:   Your physician recommends that you continue on your current medications as directed. Please refer to the Current Medication list given to you today.  Labwork:  NONE  Testing/Procedures:  NONE  Follow-Up:  Your physician recommends that you schedule a follow-up appointment in: 1 year with Dr. Harrington Challenger. You will receive a reminder letter in the mail in about 10 months reminding you to call and schedule your appointment. If you don't receive this letter, please contact our office.  Any Other Special Instructions Will Be Listed Below (If Applicable).  Call our office if you decide to have the stress test  Contact your GI doctor  If you need a refill on your cardiac medications before your next appointment, please call your pharmacy.

## 2018-09-15 DIAGNOSIS — R079 Chest pain, unspecified: Secondary | ICD-10-CM | POA: Diagnosis not present

## 2018-09-15 DIAGNOSIS — K581 Irritable bowel syndrome with constipation: Secondary | ICD-10-CM | POA: Diagnosis not present

## 2018-09-20 ENCOUNTER — Telehealth: Payer: Self-pay | Admitting: Internal Medicine

## 2018-09-20 DIAGNOSIS — R0789 Other chest pain: Secondary | ICD-10-CM

## 2018-09-20 NOTE — Telephone Encounter (Signed)
Patient stated that her stomach doctor told her that it was not stomach related. She said that R. Barrett told her that she would order a stress test  [place] that you could leave a message if she does not answer

## 2018-09-20 NOTE — Telephone Encounter (Signed)
Lexiscan ordered, will have staff call pt to schedule   Instructions will be mailed to patient

## 2018-09-20 NOTE — Telephone Encounter (Signed)
Please order and exercise (treadmill) Myoview. If she does not think she can reach target HR, change to [employer]

## 2018-09-22 ENCOUNTER — Telehealth: Payer: Self-pay | Admitting: Physician Assistant

## 2018-09-22 NOTE — Telephone Encounter (Signed)
Pre-cert Verification for the following procedure    Lexiscan Scheduled for 09-28-2018 at [facility]

## 2018-09-27 DIAGNOSIS — Z6827 Body mass index (BMI) 27.0-27.9, adult: Secondary | ICD-10-CM | POA: Diagnosis not present

## 2018-09-27 DIAGNOSIS — Z1231 Encounter for screening mammogram for malignant neoplasm of breast: Secondary | ICD-10-CM | POA: Diagnosis not present

## 2018-09-27 DIAGNOSIS — Z01419 Encounter for gynecological examination (general) (routine) without abnormal findings: Secondary | ICD-10-CM | POA: Diagnosis not present

## 2018-09-28 ENCOUNTER — Other Ambulatory Visit: Payer: Self-pay

## 2018-09-28 ENCOUNTER — Encounter (HOSPITAL_BASED_OUTPATIENT_CLINIC_OR_DEPARTMENT_OTHER)
Admission: RE | Admit: 2018-09-28 | Discharge: 2018-09-28 | Disposition: A | Discharge status: Home / Self Care | Payer: Federal, State, Local not specified - PPO | Source: Ambulatory Visit | Attending: Physician Assistant | Admitting: Physician Assistant

## 2018-09-28 ENCOUNTER — Encounter (HOSPITAL_COMMUNITY)
Admission: RE | Admit: 2018-09-28 | Discharge: 2018-09-28 | Disposition: A | Discharge status: Home / Self Care | Payer: Federal, State, Local not specified - PPO | Source: Ambulatory Visit | Attending: Physician Assistant | Admitting: Physician Assistant

## 2018-09-28 DIAGNOSIS — R0789 Other chest pain: Secondary | ICD-10-CM | POA: Diagnosis not present

## 2018-09-28 LAB — NM MYOCAR MULTI W/SPECT W/WALL MOTION / EF
LV dias vol: 49 mL (ref 46–106)
LV sys vol: 15 mL
Peak HR: 92 {beats}/min
RATE: 0.57
Rest HR: 59 {beats}/min
SDS: 2
SRS: 0
SSS: 2
TID: 0.97

## 2018-09-28 MED ORDER — TECHNETIUM TC 99M TETROFOSMIN IV KIT
30.0000 | PACK | Freq: Once | INTRAVENOUS | Status: AC | PRN
  Administered 2018-09-28: 30 via INTRAVENOUS

## 2018-09-28 MED ORDER — SODIUM CHLORIDE 0.9% FLUSH
INTRAVENOUS | Status: AC
  Administered 2018-09-28: 10 mL via INTRAVENOUS
  Filled 2018-09-28: qty 10

## 2018-09-28 MED ORDER — TECHNETIUM TC 99M TETROFOSMIN IV KIT
10.0000 | PACK | Freq: Once | INTRAVENOUS | Status: AC | PRN
  Administered 2018-09-28: 9.98 via INTRAVENOUS

## 2018-09-28 MED ORDER — REGADENOSON 0.4 MG/5ML IV SOLN
INTRAVENOUS | Status: AC
  Administered 2018-09-28: 0.4 mg via INTRAVENOUS
  Filled 2018-09-28: qty 5

## 2018-10-01 ENCOUNTER — Other Ambulatory Visit: Payer: Self-pay

## 2018-10-01 ENCOUNTER — Emergency Department (HOSPITAL_COMMUNITY): Payer: Federal, State, Local not specified - PPO

## 2018-10-01 ENCOUNTER — Inpatient Hospital Stay (HOSPITAL_COMMUNITY)
Admission: EM | Admit: 2018-10-01 | Discharge: 2018-10-03 | DRG: 282 | Disposition: A | Discharge status: Home / Self Care | Payer: Federal, State, Local not specified - PPO | Attending: Internal Medicine | Admitting: Internal Medicine

## 2018-10-01 ENCOUNTER — Encounter (HOSPITAL_COMMUNITY): Payer: Self-pay | Admitting: Emergency Medicine

## 2018-10-01 ENCOUNTER — Other Ambulatory Visit (HOSPITAL_COMMUNITY): Payer: Self-pay

## 2018-10-01 DIAGNOSIS — Z833 Family history of diabetes mellitus: Secondary | ICD-10-CM | POA: Diagnosis not present

## 2018-10-01 DIAGNOSIS — K219 Gastro-esophageal reflux disease without esophagitis: Secondary | ICD-10-CM | POA: Diagnosis present

## 2018-10-01 DIAGNOSIS — Z79890 Hormone replacement therapy (postmenopausal): Secondary | ICD-10-CM

## 2018-10-01 DIAGNOSIS — K5750 Diverticulosis of both small and large intestine without perforation or abscess without bleeding: Secondary | ICD-10-CM | POA: Diagnosis present

## 2018-10-01 DIAGNOSIS — R0902 Hypoxemia: Secondary | ICD-10-CM | POA: Diagnosis not present

## 2018-10-01 DIAGNOSIS — F329 Major depressive disorder, single episode, unspecified: Secondary | ICD-10-CM | POA: Diagnosis not present

## 2018-10-01 DIAGNOSIS — R0602 Shortness of breath: Secondary | ICD-10-CM | POA: Diagnosis not present

## 2018-10-01 DIAGNOSIS — I214 Non-ST elevation (NSTEMI) myocardial infarction: Secondary | ICD-10-CM | POA: Diagnosis present

## 2018-10-01 DIAGNOSIS — Z79899 Other long term (current) drug therapy: Secondary | ICD-10-CM | POA: Diagnosis not present

## 2018-10-01 DIAGNOSIS — E876 Hypokalemia: Secondary | ICD-10-CM

## 2018-10-01 DIAGNOSIS — Z1159 Encounter for screening for other viral diseases: Secondary | ICD-10-CM

## 2018-10-01 DIAGNOSIS — Z8261 Family history of arthritis: Secondary | ICD-10-CM

## 2018-10-01 DIAGNOSIS — I201 Angina pectoris with documented spasm: Secondary | ICD-10-CM | POA: Diagnosis not present

## 2018-10-01 DIAGNOSIS — I959 Hypotension, unspecified: Secondary | ICD-10-CM | POA: Diagnosis present

## 2018-10-01 DIAGNOSIS — Z20828 Contact with and (suspected) exposure to other viral communicable diseases: Secondary | ICD-10-CM | POA: Diagnosis not present

## 2018-10-01 DIAGNOSIS — M1990 Unspecified osteoarthritis, unspecified site: Secondary | ICD-10-CM | POA: Diagnosis not present

## 2018-10-01 DIAGNOSIS — Z87891 Personal history of nicotine dependence: Secondary | ICD-10-CM

## 2018-10-01 DIAGNOSIS — Z8249 Family history of ischemic heart disease and other diseases of the circulatory system: Secondary | ICD-10-CM | POA: Diagnosis not present

## 2018-10-01 DIAGNOSIS — F419 Anxiety disorder, unspecified: Secondary | ICD-10-CM | POA: Diagnosis present

## 2018-10-01 DIAGNOSIS — G43909 Migraine, unspecified, not intractable, without status migrainosus: Secondary | ICD-10-CM | POA: Diagnosis present

## 2018-10-01 DIAGNOSIS — K449 Diaphragmatic hernia without obstruction or gangrene: Secondary | ICD-10-CM | POA: Diagnosis not present

## 2018-10-01 DIAGNOSIS — E785 Hyperlipidemia, unspecified: Secondary | ICD-10-CM | POA: Diagnosis not present

## 2018-10-01 DIAGNOSIS — Z818 Family history of other mental and behavioral disorders: Secondary | ICD-10-CM | POA: Diagnosis not present

## 2018-10-01 DIAGNOSIS — K5730 Diverticulosis of large intestine without perforation or abscess without bleeding: Secondary | ICD-10-CM | POA: Diagnosis not present

## 2018-10-01 DIAGNOSIS — R079 Chest pain, unspecified: Secondary | ICD-10-CM | POA: Diagnosis not present

## 2018-10-01 DIAGNOSIS — Z811 Family history of alcohol abuse and dependence: Secondary | ICD-10-CM

## 2018-10-01 DIAGNOSIS — E039 Hypothyroidism, unspecified: Secondary | ICD-10-CM | POA: Diagnosis present

## 2018-10-01 DIAGNOSIS — Z90711 Acquired absence of uterus with remaining cervical stump: Secondary | ICD-10-CM | POA: Diagnosis not present

## 2018-10-01 DIAGNOSIS — Z9049 Acquired absence of other specified parts of digestive tract: Secondary | ICD-10-CM

## 2018-10-01 DIAGNOSIS — Z823 Family history of stroke: Secondary | ICD-10-CM | POA: Diagnosis not present

## 2018-10-01 DIAGNOSIS — R0600 Dyspnea, unspecified: Secondary | ICD-10-CM | POA: Diagnosis not present

## 2018-10-01 DIAGNOSIS — R739 Hyperglycemia, unspecified: Secondary | ICD-10-CM | POA: Diagnosis present

## 2018-10-01 LAB — BASIC METABOLIC PANEL
Anion gap: 9 (ref 5–15)
BUN: 11 mg/dL (ref 6–20)
CO2: 23 mmol/L (ref 22–32)
Calcium: 8.6 mg/dL — ABNORMAL LOW (ref 8.9–10.3)
Chloride: 108 mmol/L (ref 98–111)
Creatinine, Ser: 0.94 mg/dL (ref 0.44–1.00)
GFR calc Af Amer: >60 mL/min (ref >60)
GFR calc non Af Amer: >60 mL/min (ref >60)
Glucose, Bld: 104 mg/dL — ABNORMAL HIGH (ref 70–99)
Potassium: 3.5 mmol/L (ref 3.5–5.1)
Sodium: 140 mmol/L (ref 135–145)

## 2018-10-01 LAB — CBC
HCT: 38.5 % (ref 36.0–46.0)
Hemoglobin: 12.6 g/dL (ref 12.0–15.0)
MCH: 28.6 pg (ref 26.0–34.0)
MCHC: 32.7 g/dL (ref 30.0–36.0)
MCV: 87.5 fL (ref 80.0–100.0)
Platelets: 255 10*3/uL (ref 150–400)
RBC: 4.4 MIL/uL (ref 3.87–5.11)
RDW: 13.6 % (ref 11.5–15.5)
WBC: 5.3 10*3/uL (ref 4.0–10.5)
nRBC: 0 % (ref 0.0–0.2)

## 2018-10-01 LAB — TROPONIN I (HIGH SENSITIVITY)
Troponin I (High Sensitivity): 101 ng/L (ref <18)
Troponin I (High Sensitivity): 824 ng/L (ref <18)
Troponin I (High Sensitivity): 959 ng/L (ref <18)

## 2018-10-01 LAB — SARS CORONAVIRUS 2 BY RT PCR (HOSPITAL ORDER, PERFORMED IN ~~LOC~~ HOSPITAL LAB): SARS Coronavirus 2: NEGATIVE

## 2018-10-01 LAB — I-STAT BETA HCG BLOOD, ED (MC, WL, AP ONLY): I-stat hCG, quantitative: <5 m[IU]/mL (ref <5)

## 2018-10-01 MED ORDER — LORAZEPAM 1 MG PO TABS
1.0000 mg | ORAL_TABLET | Freq: Every day | ORAL | Status: DC | PRN
  Administered 2018-10-02 (×2): 1 mg via ORAL
  Filled 2018-10-01 (×2): qty 1

## 2018-10-01 MED ORDER — GABAPENTIN 300 MG PO CAPS
300.0000 mg | ORAL_CAPSULE | Freq: Two times a day (BID) | ORAL | Status: DC
  Administered 2018-10-02 – 2018-10-03 (×3): 300 mg via ORAL
  Filled 2018-10-01 (×3): qty 1

## 2018-10-01 MED ORDER — ONDANSETRON HCL 4 MG/2ML IJ SOLN
4.0000 mg | Freq: Four times a day (QID) | INTRAMUSCULAR | Status: DC | PRN
  Administered 2018-10-01: 4 mg via INTRAVENOUS
  Filled 2018-10-01: qty 2

## 2018-10-01 MED ORDER — PANTOPRAZOLE SODIUM 40 MG PO TBEC
40.0000 mg | DELAYED_RELEASE_TABLET | Freq: Every day | ORAL | Status: DC
  Administered 2018-10-02 – 2018-10-03 (×2): 40 mg via ORAL
  Filled 2018-10-01 (×2): qty 1

## 2018-10-01 MED ORDER — VORTIOXETINE HBR 10 MG PO TABS
10.0000 mg | ORAL_TABLET | Freq: Every day | ORAL | Status: DC
  Administered 2018-10-02 – 2018-10-03 (×2): 10 mg via ORAL
  Filled 2018-10-01 (×2): qty 1

## 2018-10-01 MED ORDER — ASPIRIN EC 81 MG PO TBEC
81.0000 mg | DELAYED_RELEASE_TABLET | Freq: Every day | ORAL | Status: DC
  Administered 2018-10-02: 81 mg via ORAL
  Filled 2018-10-01: qty 1

## 2018-10-01 MED ORDER — LEVOTHYROXINE SODIUM 25 MCG PO TABS
125.0000 ug | ORAL_TABLET | ORAL | Status: DC
  Administered 2018-10-03: 125 ug via ORAL
  Filled 2018-10-01: qty 1

## 2018-10-01 MED ORDER — ATORVASTATIN CALCIUM 40 MG PO TABS
40.0000 mg | ORAL_TABLET | Freq: Every day | ORAL | Status: DC
  Administered 2018-10-02: 40 mg via ORAL
  Filled 2018-10-01: qty 1

## 2018-10-01 MED ORDER — IOHEXOL 350 MG/ML SOLN
100.0000 mL | Freq: Once | INTRAVENOUS | Status: AC | PRN
  Administered 2018-10-01: 100 mL via INTRAVENOUS

## 2018-10-01 MED ORDER — LEVOTHYROXINE SODIUM 112 MCG PO TABS: 112.0000 ug | ORAL_TABLET | ORAL | Status: DC

## 2018-10-01 MED ORDER — ACETAMINOPHEN 325 MG PO TABS
650.0000 mg | ORAL_TABLET | ORAL | Status: DC | PRN
  Administered 2018-10-02 (×2): 650 mg via ORAL
  Filled 2018-10-01 (×3): qty 2

## 2018-10-01 MED ORDER — FAMOTIDINE IN NACL 20-0.9 MG/50ML-% IV SOLN
20.0000 mg | Freq: Once | INTRAVENOUS | Status: AC
  Administered 2018-10-01: 20 mg via INTRAVENOUS
  Filled 2018-10-01: qty 50

## 2018-10-01 MED ORDER — LAMOTRIGINE 100 MG PO TABS
100.0000 mg | ORAL_TABLET | Freq: Two times a day (BID) | ORAL | Status: DC
  Administered 2018-10-02 – 2018-10-03 (×3): 100 mg via ORAL
  Filled 2018-10-01 (×4): qty 1

## 2018-10-01 MED ORDER — MELATONIN 3 MG PO TABS
3.0000 mg | ORAL_TABLET | Freq: Every day | ORAL | Status: DC
  Administered 2018-10-02 (×2): 3 mg via ORAL
  Filled 2018-10-01 (×3): qty 1

## 2018-10-01 MED ORDER — SODIUM CHLORIDE 0.9% FLUSH
3.0000 mL | Freq: Once | INTRAVENOUS | Status: AC
  Administered 2018-10-01: 3 mL via INTRAVENOUS

## 2018-10-01 MED ORDER — LEVOTHYROXINE SODIUM 25 MCG PO TABS: 125.0000 ug | ORAL_TABLET | ORAL | Status: DC

## 2018-10-01 MED ORDER — HEPARIN (PORCINE) 25000 UT/250ML-% IV SOLN
1200.0000 [IU]/h | INTRAVENOUS | Status: DC
  Administered 2018-10-01: 900 [IU]/h via INTRAVENOUS
  Filled 2018-10-01 (×2): qty 250

## 2018-10-01 MED ORDER — NITROGLYCERIN IN D5W 200-5 MCG/ML-% IV SOLN
0.0000 ug/min | INTRAVENOUS | Status: DC
  Administered 2018-10-01: 5 ug/min via INTRAVENOUS
  Filled 2018-10-01: qty 250

## 2018-10-01 MED ORDER — LEVOTHYROXINE SODIUM 112 MCG PO TABS
112.0000 ug | ORAL_TABLET | ORAL | Status: DC
  Administered 2018-10-03: 112 ug via ORAL
  Filled 2018-10-01: qty 1

## 2018-10-01 MED ORDER — HEPARIN BOLUS VIA INFUSION
4000.0000 [IU] | Freq: Once | INTRAVENOUS | Status: AC
  Administered 2018-10-01: 4000 [IU] via INTRAVENOUS
  Filled 2018-10-01: qty 4000

## 2018-10-01 MED ORDER — MORPHINE SULFATE (PF) 2 MG/ML IV SOLN: 1.0000 mg | Freq: Once | INTRAVENOUS | Status: DC

## 2018-10-01 MED ORDER — SODIUM CHLORIDE 0.9 % IV BOLUS
500.0000 mL | Freq: Once | INTRAVENOUS | Status: AC
  Administered 2018-10-01: 500 mL via INTRAVENOUS

## 2018-10-01 MED ORDER — ALUM & MAG HYDROXIDE-SIMETH 200-200-20 MG/5ML PO SUSP
15.0000 mL | Freq: Once | ORAL | Status: AC
  Administered 2018-10-01: 15 mL via ORAL
  Filled 2018-10-01: qty 30

## 2018-10-01 NOTE — ED Triage Notes (Signed)
Pt arrives from home via [facility] reporting sudden onset CP at rest, midsternal radiating slightly to back, describes as "gnawing."  EMS reports giving 324 ASA and 2 NTG SL with no reported change in pain.  Pt endorses SOB, denies dizziness, n/v.  Pt denies fevers, cough, congestion.  Pt reports stress test on 09/28/18, not yet resulted.  Pt reports this episode CP worse than previous episodes.

## 2018-10-01 NOTE — ED Provider Notes (Signed)
1900: Patient under initial care of Dr.Wentz, handed off to me at shift change.  Please see previous note for full H&P.  Briefly patient is a 58 year old who is here with chest pain.  EKG without ischemic changes but trop 101 > 824. She is having persistent chest discomfort. There is concern for ACS/NSTEMI.    Plan is for ER cardiology evaluation, CTA to rule out aortic dissection, admission.  Reconsult cardiology vs CT surgery based on findings and admit by cardiology.  She has received heparin, nitroglycerin here. HD stable at shift change.  Physical Exam  BP 106/75   Pulse (!) 58   Temp 98.3 F (36.8 C) (Oral)   Resp 14   Ht 5\' 6"  (1.676 m)   Wt 77.1 kg   SpO2 99%   BMI 27.44 kg/m   Physical Exam Constitutional:      Appearance: She is well-developed.  HENT:     Head: Normocephalic.     Nose: Nose normal.  Eyes:     General: Lids are normal.  Neck:     Musculoskeletal: Normal range of motion.  Cardiovascular:     Rate and Rhythm: Normal rate.  Pulmonary:     Effort: Pulmonary effort is normal. No respiratory distress.  Musculoskeletal: Normal range of motion.  Neurological:     Mental Status: She is alert.  Psychiatric:        Behavior: Behavior normal.     ED Course/Procedures   Clinical Course as of Sep 30 2101  Sat Oct 01, 2018  1521 No infiltrate or CHF, image reviewed by me  DG Chest Portable 1 View [EW]  1522 Normal  I-Stat beta hCG blood, ED [EW]  1522 Normal  CBC [EW]  1522 Initial, first troponin, high  Troponin I (High Sensitivity)(!!) [EW]  1523 Normal except glucose high, calcium low   [EW]  1537 Initial troponin high, indicating possible myocardial infarction.  Repeat troponin pending for 1620, blood draw.  Patient remains comfortable at this time.   [EW]  1756 Elevated, significant change from initial, done 130 minutes prior.  Troponin I (High Sensitivity)(!!) [EW]  1758 Heparin and nitroglycerin ordered, will contact cardiology for admission.    [EW]  2103 IMPRESSION: 1. No acute intrathoracic, abdominal, or pelvic pathology. No aorticdissection or aneurysm. No CT evidence of pulmonary embolism. 2. Colonic diverticulosis. No bowel obstruction or active inflammation. Normal appendix.   CT Angio Chest/Abd/Pel for Dissection W and/or W/WO [CG]    Clinical Course User Index [CG] Kinnie Feil, PA-C [EW] Daleen Bo, MD    .Critical Care Performed by: Kinnie Feil, PA-C Authorized by: Kinnie Feil, PA-C   Critical care provider statement:    Critical care time (minutes):  45   Critical care was necessary to treat or prevent imminent or life-threatening deterioration of the following conditions:  Cardiac failure (NSTEMI)   Critical care was time spent personally by me on the following activities:  Discussions with consultants, evaluation of patient's response to treatment, examination of patient, ordering and performing treatments and interventions, ordering and review of laboratory studies, ordering and review of radiographic studies, pulse oximetry, re-evaluation of patient's condition, obtaining history from patient or surrogate, review of old charts and development of treatment plan with patient or surrogate   I assumed direction of critical care for this patient from another provider in my specialty: no      MDM   2035: Cardiology fellow has evaluate patient in ER. If CTA negative anticipate  LHC.  Will f/u on CTA and page cards vs CT surgery accordingly.   2100: CTA negative for dissection. Discussed with cards fellow. Cardiology will admit.     Liberty HandyGibbons, Donevin Sainsbury J, PA-C 10/01/18 2104    Mancel BaleWentz, Elliott, MD 10/03/18 1115

## 2018-10-01 NOTE — ED Notes (Signed)
ED Provider at bedside. 

## 2018-10-01 NOTE — ED Notes (Signed)
MD aware patient having chest pain 3/4.  EKG was reviewed by her.  Patient transferred to [facility].

## 2018-10-01 NOTE — ED Provider Notes (Signed)
[facility]   CSN: [id number] Arrival date & time: 10/01/18  1414    History   Chief Complaint Chief Complaint  Patient presents with  . Chest Pain    HPI Ariel Ramirez is a 58 y.o. female.     HPI  She presents for evaluation of discomfort in her chest described as "a punch,", initial 8/10, now 4/10, after treatment by EMS with aspirin and nitroglycerin, x2.  Pain started shortly after she was doing some light dusting, at her mother's house.  No associated diaphoresis, nausea, vomiting, weakness or dizziness.  Recently, she had a stress test, results below, as evaluation of her ongoing shortness of breath.  No prior cardiac history.  No other recent illnesses.  There are no other known modifying factors.   Nuclear medicine myocardial scan, 09/28/2018:  There was no ST segment deviation noted during stress.  The study is normal.  This is a low risk study. No ischemia or scar.  Nuclear stress EF: 69%.       Past Medical History:  Diagnosis Date  . Anxiety   . Arthritis   . Chest pain, atypical 2015   Echo and stress echo were okay  . Depression   . Hypothyroid     Patient Active Problem List   Diagnosis Date Noted  . Chest pain, atypical   . Hypothyroid   . Anxiety   . Unspecified constipation 12/22/2012  . Bipolar disorder, unspecified (HCC) 05/25/2012  . Post traumatic stress disorder (PTSD) 05/25/2012  . Arthritis 05/25/2012  . Depression 05/25/2012  . GERD (gastroesophageal reflux disease) 10/26/2011  . Abdominal pain, chronic, right upper quadrant 09/25/2011  . HYPOTHYROIDISM 01/22/2010  . UNSPECIFIED ANEMIA 01/22/2010  . ALOPECIA 01/22/2010  . CRAMP OF LIMB 01/22/2010    Past Surgical History:  Procedure Laterality Date  . CHOLECYSTECTOMY  11/13/2011   Procedure: LAPAROSCOPIC CHOLECYSTECTOMY;  Surgeon: Dalia HeadingMark A Jenkins, MD;  Location: [facility] ORS;  Service: General;  Laterality: N/A;  .  ESOPHAGOGASTRODUODENOSCOPY  11/09/2011   Procedure: ESOPHAGOGASTRODUODENOSCOPY (EGD);  Surgeon: Malissa HippoNajeeb U Rehman, MD;  Location: [facility] END[facility] SUITE;  Service: Endoscopy;  Laterality: N/A;  730  . PARTIAL HYSTERECTOMY     2011  . TUBAL LIGATION     20 yrs ago     OB History   No obstetric history on file.      Home Medications    Prior to Admission medications   Medication Sig Start Date End Date Taking? Authorizing Provider  clonazePAM (KLONOPIN) 0.5 MG tablet Take 1 tablet by mouth 2 (two) times daily as needed. Reported on 09/19/2015 03/02/13  Yes [provider]  estradiol (ESTRACE) 0.5 MG tablet Take 0.5 mg by mouth daily.   Yes [provider]  gabapentin (NEURONTIN) 300 MG capsule Take 300 mg by mouth 2 (two) times daily. Anxiety Attacks    Yes [provider]  hydrOXYzine (ATARAX/VISTARIL) 10 MG tablet Take 10 mg by mouth daily as needed for itching or anxiety.    Yes [provider]  lamoTRIgine (LAMICTAL) 100 MG tablet Take 100 mg by mouth 2 (two) times daily.    Yes [provider]  levothyroxine (SYNTHROID, LEVOTHROID) 112 MCG tablet Take 112 mcg by mouth every other day. Alternate with 125 mg   Yes [provider]  levothyroxine (SYNTHROID, LEVOTHROID) 125 MCG tablet Take 125 mcg by mouth every other day. Alternate with 112 mcg   Yes [provider]  LORazepam (  ATIVAN) 1 MG tablet Take 1 mg by mouth daily as needed for anxiety.   Yes [provider]  Melatonin 10 MG TABS Take 10 mg by mouth at bedtime.   Yes [provider]  pantoprazole (PROTONIX) 40 MG tablet Take 40 mg by mouth daily.   Yes [provider]  vortioxetine HBr (TRINTELLIX) 10 MG TABS tablet Take 10 mg by mouth daily.   Yes [provider]    Family History Family History  Problem Relation Age of Onset  . Arthritis Mother   . Stroke Father   . Hypertension Father   . Diabetes Father   . Alcohol abuse Father   .  Healthy Son   . Healthy Son   . Bipolar disorder Son   . ADD / ADHD Son   . Healthy Daughter   . Depression Sister   . Anxiety disorder Sister   . Depression Brother   . Depression Paternal Aunt     Social History Social History   Tobacco Use  . Smoking status: Former Smoker    Types: Cigarettes    Quit date: 10/26/2007    Years since quitting: 10.9  . Smokeless tobacco: Never Used  Substance Use Topics  . Alcohol use: Yes    Alcohol/week: 3.0 standard drinks    Types: 3 Glasses of wine per week    Comment: on occassion  . Drug use: No     Allergies   Patient has no known allergies.   Review of Systems Review of Systems  All other systems reviewed and are negative.    Physical Exam Updated Vital Signs BP 139/84   Pulse 61   Temp 98.3 F (36.8 C) (Oral)   Resp 10   Ht 5\' 6"  (1.676 m)   Wt 77.1 kg   SpO2 100%   BMI 27.44 kg/m   Physical Exam Vitals signs and nursing note reviewed.  Constitutional:      General: She is not in acute distress.    Appearance: She is well-developed. She is not ill-appearing, toxic-appearing or diaphoretic.  HENT:     Head: Normocephalic and atraumatic.     Right Ear: External ear normal.     Left Ear: External ear normal.  Eyes:     Conjunctiva/sclera: Conjunctivae normal.     Pupils: Pupils are equal, round, and reactive to light.  Neck:     Musculoskeletal: Normal range of motion and neck supple.     Trachea: Phonation normal.  Cardiovascular:     Rate and Rhythm: Normal rate and regular rhythm.     Heart sounds: Normal heart sounds.  Pulmonary:     Effort: Pulmonary effort is normal.     Breath sounds: Normal breath sounds.  Chest:     Chest wall: No tenderness.  Abdominal:     General: There is no distension.     Palpations: Abdomen is soft.     Tenderness: There is no abdominal tenderness.  Musculoskeletal: Normal range of motion.        General: No swelling or tenderness.  Skin:    General: Skin is warm  and dry.     Coloration: Skin is not jaundiced or pale.  Neurological:     Mental Status: She is alert and oriented to person, place, and time.     Cranial Nerves: No cranial nerve deficit.     Sensory: No sensory deficit.     Motor: No abnormal muscle tone.  Coordination: Coordination normal.  Psychiatric:        Mood and Affect: Mood normal.        Behavior: Behavior normal.        Thought Content: Thought content normal.        Judgment: Judgment normal.      ED Treatments / Results  Labs (all labs ordered are listed, but only abnormal results are displayed) Labs Reviewed  BASIC METABOLIC PANEL - Abnormal; Notable for the following components:      Result Value   Glucose, Bld 104 (*)    Calcium 8.6 (*)    All other components within normal limits  TROPONIN I (HIGH SENSITIVITY) - Abnormal; Notable for the following components:   Troponin I (High Sensitivity) 101 (*)    All other components within normal limits  TROPONIN I (HIGH SENSITIVITY) - Abnormal; Notable for the following components:   Troponin I (High Sensitivity) 824 (*)    All other components within normal limits  SARS CORONAVIRUS 2 (HOSPITAL ORDER, PERFORMED IN [facility])  CBC  I-STAT BETA HCG BLOOD, ED ([facility], [facility], [facility] ONLY)    EKG EKG Interpretation  Date/Time:  Saturday October 01 2018 14:17:38 EDT Ventricular Rate:  70 PR Interval:    QRS Duration: 89 QT Interval:  381 QTC Calculation: 412 R Axis:   30 Text Interpretation:  Sinus rhythm Abnormal R-wave progression, early transition Since last tracing ST now depressed in Inferior leads Confirmed by Mancel BaleWentz, Floyce Bujak [phone]([id number]) on 10/01/2018 3:10:12 PM   Radiology Dg Chest Portable 1 View  Result Date: 10/01/2018 CLINICAL DATA:  Chest pain and shortness of breath. Ex-smoker. EXAM: PORTABLE CHEST 1 VIEW COMPARISON:  12/16/2016. FINDINGS: Normal sized heart. Clear lungs with normal vascularity. Unremarkable bones. IMPRESSION: Normal examination.  Electronically Signed   By: Beckie SaltsSteven  Reid M.D.   On: 10/01/2018 15:01    Procedures .Critical Care Performed by: Mancel BaleWentz, Laquasia Pincus, MD Authorized by: Mancel BaleWentz, Martin Belling, MD   Critical care provider statement:    Critical care time (minutes):  45   Critical care start time:  10/01/2018 3:15 PM   Critical care end time:  10/01/2018 6:16 PM   Critical care time was exclusive of:  Separately billable procedures and treating other patients   Critical care was necessary to treat or prevent imminent or life-threatening deterioration of the following conditions:  Cardiac failure   Critical care was time spent personally by me on the following activities:  Blood draw for specimens, development of treatment plan with patient or surrogate, discussions with consultants, evaluation of patient's response to treatment, examination of patient, obtaining history from patient or surrogate, ordering and performing treatments and interventions, ordering and review of laboratory studies, pulse oximetry, re-evaluation of patient's condition, review of old charts and ordering and review of radiographic studies   (including critical care time)  Medications Ordered in ED Medications  nitroGLYCERIN 50 mg in dextrose 5 % 250 mL (0.2 mg/mL) infusion (has no administration in time range)  sodium chloride flush (NS) 0.9 % injection 3 mL (3 mLs Intravenous Given 10/01/18 1631)     Initial Impression / Assessment and Plan / ED Course  I have reviewed the triage vital signs and the nursing notes.  Pertinent labs & imaging results that were available during my care of the patient were reviewed by me and considered in my medical decision making (see chart for details).  Clinical Course as of Sep 30 1801  Sat Oct 01, 2018  1521 No  infiltrate or CHF, image reviewed by me  DG Chest Portable 1 View [EW]  1522 Normal  I-Stat beta hCG blood, ED [EW]  1522 Normal  CBC [EW]  1522 Initial, first troponin, high  Troponin I (High  Sensitivity)(!!) [EW]  1523 Normal except glucose high, calcium low   [EW]  1537 Initial troponin high, indicating possible myocardial infarction.  Repeat troponin pending for 1620, blood draw.  Patient remains comfortable at this time.   [EW]  1756 Elevated, significant change from initial, done 130 minutes prior.  Troponin I (High Sensitivity)(!!) [EW]  1758 Heparin and nitroglycerin ordered, will contact cardiology for admission.   [EW]    Clinical Course User Index [EW] Mancel BaleWentz, Marcianna Daily, MD        Patient Vitals for the past 24 hrs:  BP Temp Temp src Pulse Resp SpO2 Height Weight  10/01/18 1746 139/84 - - 61 10 100 % - -  10/01/18 1630 131/74 - - 62 17 100 % - -  10/01/18 1615 (!) 135/56 - - 68 17 98 % - -  10/01/18 1600 127/79 - - (!) 53 - 100 % - -  10/01/18 1545 126/74 - - 61 - 100 % - -  10/01/18 1530 118/64 - - 68 14 100 % - -  10/01/18 1522 124/70 - - 63 17 99 % - -  10/01/18 1515 124/70 - - 66 15 99 % - -  10/01/18 1500 (!) 112/58 - - - (!) 8 - - -  10/01/18 1445 116/69 - - 64 18 99 % - -  10/01/18 1430 117/72 - - (!) 59 - 97 % - -  10/01/18 1423 - - - - - - 5\' 6"  (1.676 m) 77.1 kg  10/01/18 1421 120/75 98.3 F (36.8 C) Oral 65 15 97 % - -  10/01/18 1415 - - - - - 96 % - -    5:58 PM Reevaluation with update and discussion. After initial assessment and treatment, an updated evaluation reveals she states that 10 minutes ago her pain returned and is now 7/10.  It still feels like "a punch."  Heparin and nitroglycerin ordered, repeat EKG done and does not indicate infarct.  Will consult cardiology. Mancel BaleElliott Kaysan Peixoto   Medical Decision Making: Chest pain, waxing and waning, with initial abnormal troponin level, increasing after 2 hours.  This is indicative of acute myocardial injury/infarct.  Repeat EKG does not indicate STEMI.  Recent Lexiscan stress testing was negative.  Patient will require hospitalization, aggressive management and consideration for cardiac  catheterization.  CRITICAL CARE- yes Performed by: Mancel BaleElliott Daanya Lanphier  Nursing Notes Reviewed/ Care Coordinated Applicable Imaging Reviewed Interpretation of Laboratory Data incorporated into ED treatment  6:13 PM-cardiology return page and will see the patient in the ED.  Plan: Admit to cardiology  Final Clinical Impressions(s) / ED Diagnoses   Final diagnoses:  NSTEMI (non-ST elevated myocardial infarction) [facility](HCC)    ED Discharge Orders    None       Mancel BaleWentz, Lowella Kindley, MD 10/01/18 [phone]

## 2018-10-01 NOTE — Progress Notes (Signed)
ANTICOAGULATION CONSULT NOTE - Initial Consult  Pharmacy Consult for heparin Indication: chest pain/ACS  No Known Allergies  Patient Measurements: Height: 5\' 6"  (167.6 cm) Weight: 170 lb (77.1 kg) IBW/kg (Calculated) : 59.3 Heparin Dosing Weight: 75 kg  Vital Signs: Temp: 98.3 F (36.8 C) (07/18 1421) Temp Source: Oral (07/18 1421) BP: 139/84 (07/18 1746) Pulse Rate: 61 (07/18 1746)  Labs: Recent Labs    10/01/18 1420 10/01/18 1630  HGB 12.6  --   HCT 38.5  --   PLT 255  --   CREATININE 0.94  --   TROPONINIHS 101* 824*    Estimated Creatinine Clearance: 70 mL/min (by C-G formula based on SCr of 0.94 mg/dL).   Medical History: Past Medical History:  Diagnosis Date  . Anxiety   . Arthritis   . Chest pain, atypical 2015   Echo and stress echo were okay  . Depression   . Hypothyroid     Assessment: Ariel Ramirez presenting with sudden onset CP with SOB. No AC PTA.   Hgb 12.6, plt 255. Trop 240>973. No s/sx of bleeding.   Goal of Therapy:  Heparin level 0.3-0.7 units/ml Monitor platelets by anticoagulation protocol: Yes   Plan:  Give 4000 units bolus x 1 Start heparin infusion at 900 units/hr Check anti-Xa level in 6 hours and daily while on heparin Continue to monitor H&H and platelets  Antonietta Jewel, PharmD, [place] Clinical Pharmacist  Pager: [phone] Phone: [phone] 10/01/2018,5:59 PM

## 2018-10-01 NOTE — ED Notes (Signed)
Patient transported to CT 

## 2018-10-01 NOTE — H&P (Signed)
Cardiology Admission History and Physical:   Patient ID: Ariel Ramirez MRN: [id number]; DOB: 1960/07/14   Admission date: 10/01/2018  Primary Care Provider: Joette Catching, MD Primary Cardiologist: Dietrich Pates, MD  Primary Electrophysiologist:  None   Chief Complaint:  Chest pain  Patient Profile:   Ariel Ramirez is a 58 y.o. female with hypothyroidism, anxiety/depression, and GERD s/p recent esophageal dilatation who presents with sudden onset chest pain and abnormal hsTn.  History of Present Illness:   Patient was in her usual state of health until earlier this afternoon when, while seated, eating lunch, after doing light housework, she developed sudden onset 10/10 substernal chest pressure. It radiated to her back and shoulder, but not to jaw or arms. She doesn't remember feeling diaphoretic or nauseous. She describes the pain as a pressure or squeezing like sensation that was very different from the pain she had previously experienced. She got in the car and drove to a nearby fire station where she was given 325 of ASA and was transferred to the [facility] ED.   Upon arrival, patient's CP was a 4/10 after ASA and SLN x 2. Initial labs were notable for a hsTn of 101 -> 824 -> 959. Initial EKG at triage with sub mm STE in AVL and I, which resolved on subsequent EKGs. Nitro gtt was started and uptitrated to 50 mcg with improvement in her pain to 3/10. CTA ruled out aortic pathology.  Of note, she was recently seen by cardiology for concerns of chest pain and increasing shortness of breath. She underwent a nuclear stress on 09/28/18 with normal perfusion, normal LV systolic function, and a calculated EF of 69%. EKG was without ST changes. She did experience dyspnea during the study.   On review of systems, she denies fever, chills, cough. Has had no N/V/D.    Past Medical History:  Diagnosis Date  . Anxiety   . Arthritis   . Chest pain, atypical 2015   Echo and stress echo were okay  .  Depression   . Hypothyroid     Past Surgical History:  Procedure Laterality Date  . CHOLECYSTECTOMY  11/13/2011   Procedure: LAPAROSCOPIC CHOLECYSTECTOMY;  Surgeon: Dalia Heading, MD;  Location: [facility] ORS;  Service: General;  Laterality: N/A;  . ESOPHAGOGASTRODUODENOSCOPY  11/09/2011   Procedure: ESOPHAGOGASTRODUODENOSCOPY (EGD);  Surgeon: Malissa Hippo, MD;  Location: [facility] END[facility] SUITE;  Service: Endoscopy;  Laterality: N/A;  730  . PARTIAL HYSTERECTOMY     2011  . TUBAL LIGATION     20 yrs ago     Medications Prior to Admission: Prior to Admission medications   Medication Sig Start Date End Date Taking? Authorizing Provider  clonazePAM (KLONOPIN) 0.5 MG tablet Take 1 tablet by mouth 2 (two) times daily as needed. Reported on 09/19/2015 03/02/13  Yes [provider]  estradiol (ESTRACE) 0.5 MG tablet Take 0.5 mg by mouth daily.   Yes [provider]  gabapentin (NEURONTIN) 300 MG capsule Take 300 mg by mouth 2 (two) times daily. Anxiety Attacks    Yes [provider]  hydrOXYzine (ATARAX/VISTARIL) 10 MG tablet Take 10 mg by mouth daily as needed for itching or anxiety.    Yes [provider]  lamoTRIgine (LAMICTAL) 100 MG tablet Take 100 mg by mouth 2 (two) times daily.    Yes [provider]  levothyroxine (SYNTHROID, LEVOTHROID) 112 MCG tablet Take 112 mcg by mouth every other day. Alternate with 125 mg   Yes [provider]  levothyroxine (SYNTHROID, LEVOTHROID) 125 MCG tablet Take 125 mcg by mouth every other day. Alternate with 112 mcg   Yes [provider]  LORazepam (ATIVAN) 1 MG tablet Take 1 mg by mouth daily as needed for anxiety.   Yes [provider]  Melatonin 10 MG TABS Take 10 mg by mouth at bedtime.   Yes [provider]  pantoprazole (PROTONIX) 40 MG tablet Take 40 mg by mouth daily.   Yes [provider]  vortioxetine HBr (TRINTELLIX) 10 MG TABS tablet Take 10 mg by mouth daily.   Yes  [provider]     Allergies:   No Known Allergies  Social History:   Social History   Socioeconomic History  . Marital status: Married    Spouse name: Not on file  . Number of children: Not on file  . Years of education: Not on file  . Highest education level: Not on file  Occupational History  . Not on file  Social Needs  . Financial resource strain: Not on file  . Food insecurity    Worry: Not on file    Inability: Not on file  . Transportation needs    Medical: Not on file    Non-medical: Not on file  Tobacco Use  . Smoking status: Former Smoker    Types: Cigarettes    Quit date: 10/26/2007    Years since quitting: 10.9  . Smokeless tobacco: Never Used  Substance and Sexual Activity  . Alcohol use: Yes    Alcohol/week: 3.0 standard drinks    Types: 3 Glasses of wine per week    Comment: on occassion  . Drug use: No  . Sexual activity: Yes    Birth control/protection: None  Lifestyle  . Physical activity    Days per week: Not on file    Minutes per session: Not on file  . Stress: Not on file  Relationships  . Social Herbalist on phone: Not on file    Gets together: Not on file    Attends religious service: Not on file    Active member of club or organization: Not on file    Attends meetings of clubs or organizations: Not on file    Relationship status: Not on file  . Intimate partner violence    Fear of current or ex partner: Not on file    Emotionally abused: Not on file    Physically abused: Not on file    Forced sexual activity: Not on file  Other Topics Concern  . Not on file  Social History Narrative  . Not on file    Family History:   The patient's family history includes ADD / ADHD in her son; Alcohol abuse in her father; Anxiety disorder in her sister; Arthritis in her mother; Bipolar disorder in her son; Depression in her brother, paternal aunt, and sister; Diabetes in her father; Healthy in her daughter, son, and son;  Hypertension in her father; Stroke in her father.    Review of Systems: [y] = yes, [ ]  = no     General: Weight gain [ ] ; Weight loss [ ] ; Anorexia [ ] ; Fatigue [ ] ; Fever [ ] ; Chills [ ] ; Weakness [ ]    Cardiac: Chest pain/pressure [ ] ; Resting SOB [ ] ; Exertional SOB [ ] ; Orthopnea [ ] ; Pedal Edema [ ] ; Palpitations [ ] ; Syncope [ ] ; Presyncope [ ] ; Paroxysmal nocturnal dyspnea[ ]    Pulmonary: Cough [ ] ;  Wheezing[ ] ; Hemoptysis[ ] ; Sputum [ ] ; Snoring [ ]    GI: Vomiting[ ] ; Dysphagia[ ] ; Melena[ ] ; Hematochezia [ ] ; Heartburn[ ] ; Abdominal pain [ ] ; Constipation [ ] ; Diarrhea [ ] ; BRBPR [ ]    GU: Hematuria[ ] ; Dysuria [ ] ; Nocturia[ ]    Vascular: Pain in legs with walking [ ] ; Pain in feet with lying flat [ ] ; Non-healing sores [ ] ; Stroke [ ] ; TIA [ ] ; Slurred speech [ ] ;   Neuro: Headaches[ ] ; Vertigo[ ] ; Seizures[ ] ; Paresthesias[ ] ;Blurred vision [ ] ; Diplopia [ ] ; Vision changes [ ]    Ortho/Skin: Arthritis [ ] ; Joint pain [ ] ; Muscle pain [ ] ; Joint swelling [ ] ; Back Pain [ ] ; Rash [ ]    Psych: Depression[ ] ; Anxiety[ ]    Heme: Bleeding problems [ ] ; Clotting disorders [ ] ; Anemia [ ]    Endocrine: Diabetes [ ] ; Thyroid dysfunction[ ]   Physical Exam/Data:   Vitals:   10/01/18 2140 10/01/18 2145 10/01/18 2150 10/01/18 2220  BP: 124/79 (!) 152/69 115/61 104/71  Pulse:    (!) 54  Resp: 13 12 15 11   Temp:      TempSrc:      SpO2:    99%  Weight:      Height:       No intake or output data in the 24 hours ending 10/01/18 2224 Filed Weights   10/01/18 1423  Weight: 77.1 kg   Body mass index is 27.44 kg/m.  General:  Well nourished, well developed. In mild distress.  HEENT: normal Lymph: no adenopathy Neck: no Endocrine:  No thryomegaly Vascular: No carotid bruits; FA pulses 2+ bilaterally without bruits  Cardiac:  normal S1, S2; RRR; no murmur  Lungs:  clear to auscultation bilaterally, no wheezing, rhonchi or rales  Abd: soft, nontender, no hepatomegaly  Ext:  no edema Musculoskeletal:  No deformities, BUE and BLE strength normal and equal Skin: warm and dry  Neuro:  CNs 2-12 intact, no focal abnormalities noted Psych:  Normal affect    EKG:  The ECG that was done was reviewed personally and demostrates SR with normal axis, intervals, and sub mm STE in AVL and I.   Relevant CV Studies: Stress Myoview 09/28/18:  There was no ST segment deviation noted during stress.  The study is normal.  This is a low risk study. No ischemia or scar.  Nuclear stress EF: 69%.  TTE 04/2013: Normal systolic function.   Laboratory Data:  Chemistry Recent Labs  Lab 10/01/18 1420  NA 140  K 3.5  CL 108  CO2 23  GLUCOSE 104*  BUN 11  CREATININE 0.94  CALCIUM 8.6*  GFRNONAA >60  GFRAA >60  ANIONGAP 9    No results for input(s): PROT, ALBUMIN, AST, ALT, ALKPHOS, BILITOT in the last 168 hours. Hematology Recent Labs  Lab 10/01/18 1420  WBC 5.3  RBC 4.40  HGB 12.6  HCT 38.5  MCV 87.5  MCH 28.6  MCHC 32.7  RDW 13.6  PLT 255   Cardiac Enzymes: hsTn 101 -> 824 -> 959  BNPNo results for input(s): BNP, PROBNP in the last 168 hours.  DDimer No results for input(s): DDIMER in the last 168 hours.  Radiology/Studies:  Dg Chest Portable 1 View  Result Date: 10/01/2018 CLINICAL DATA:  Chest pain and shortness of breath. Ex-smoker. EXAM: PORTABLE CHEST 1 VIEW COMPARISON:  12/16/2016. FINDINGS: Normal sized heart. Clear lungs with normal vascularity. Unremarkable bones. IMPRESSION: Normal examination. Electronically Signed   By: Viviann SpareSteven  Azucena Kubaeid M.D.   On: 10/01/2018 15:01   Ct Angio Chest/abd/pel For Dissection W And/or W/wo  Result Date: 10/01/2018 CLINICAL DATA:  58 year old female with chest pain. EXAM: CT ANGIOGRAPHY CHEST, ABDOMEN AND PELVIS TECHNIQUE: Multidetector CT imaging through the chest, abdomen and pelvis was performed using the standard protocol during bolus administration of intravenous contrast. Multiplanar reconstructed images and  MIPs were obtained and reviewed to evaluate the vascular anatomy. CONTRAST:  100mL OMNIPAQUE IOHEXOL 350 MG/ML SOLN COMPARISON:  Chest radiograph dated 10/01/2018 and CT of the abdomen pelvis dated 09/29/2011 FINDINGS: CTA CHEST FINDINGS Cardiovascular: There is mild dilatation of the left cardiac chambers. No pericardial effusion. The thoracic aorta is unremarkable. The origins of the great vessels of the aortic arch appear patent as visualized. Mild dilatation of the main pulmonary trunk which may represent a degree of pulmonary hypertension. Clinical correlation is recommended. No CT evidence of pulmonary embolism. Mediastinum/Nodes: No hilar or mediastinal adenopathy. There is a small hiatal hernia. The esophagus and the thyroid gland are grossly unremarkable. No mediastinal fluid collection. Lungs/Pleura: The lungs are clear. There is no pleural effusion or pneumothorax. The central airways are patent. Musculoskeletal: No chest wall abnormality. No acute or significant osseous findings. Review of the MIP images confirms the above findings. CTA ABDOMEN AND PELVIS FINDINGS VASCULAR Aorta: Normal caliber aorta without aneurysm, dissection, vasculitis or significant stenosis. Celiac: Patent without evidence of aneurysm, dissection, vasculitis or significant stenosis. SMA: Patent without evidence of aneurysm, dissection, vasculitis or significant stenosis. Renals: Both renal arteries are patent without evidence of aneurysm, dissection, vasculitis, fibromuscular dysplasia or significant stenosis. IMA: Patent without evidence of aneurysm, dissection, vasculitis or significant stenosis. Inflow: Patent without evidence of aneurysm, dissection, vasculitis or significant stenosis. Veins: No obvious venous abnormality within the limitations of this arterial phase study. Review of the MIP images confirms the above findings. NON-VASCULAR No intra-abdominal free air or free fluid. Hepatobiliary: The liver is unremarkable. No  intrahepatic biliary ductal dilatation. Cholecystectomy. Pancreas: Unremarkable. No pancreatic ductal dilatation or surrounding inflammatory changes. Spleen: Normal in size without focal abnormality. Adrenals/Urinary Tract: Adrenal glands are unremarkable. Kidneys are normal, without renal calculi, focal lesion, or hydronephrosis. Bladder is unremarkable. Stomach/Bowel: There is sigmoid diverticulosis without active inflammatory changes. There is moderate stool throughout the colon. There is no bowel obstruction or active inflammation. There is a 2 cm duodenal diverticulum. The appendix is normal. Lymphatic: No adenopathy. Reproductive: Hysterectomy. No pelvic mass. Other: None Musculoskeletal: Degenerative changes primarily at L5-S1. No acute osseous pathology. Review of the MIP images confirms the above findings. IMPRESSION: 1. No acute intrathoracic, abdominal, or pelvic pathology. No aortic dissection or aneurysm. No CT evidence of pulmonary embolism. 2. Colonic diverticulosis. No bowel obstruction or active inflammation. Normal appendix. Electronically Signed   By: Elgie CollardArash  Radparvar M.D.   On: 10/01/2018 20:51    Assessment and Plan:   Ms. Lurene ShadowLaprade is a 58 year old woman with anxiety, ?esophageal stricture s/p dilatation, and hypothyroidism who presents with acute onset chest pain found to have abnormal cardiac biomarkers. Of note, patient has had recent work up for myocardial ischemia including myoview on 09/28/18 without perfusion abnormalities. Her hsTn was impressively abnormal, however, and her CP improved on nitroglycerin. Importantly, it seems as though this may be a different chest pain phenotype than what she had described as a more chronic symptom prior. A CTA was done given that her pain radiated to he back and did not demonstrate aortic pathology, nor did it show (at least to my  eye) extensive coronary artery calcifications. At this point, given the chronicity of her pain syndrome, the episode  today, and her abnormal biomarkers I think she warrants coronary angiography to define her coronary anatomy.   I personally review her EKGs, troponin trend, and history with Dr. Allyson SabalBerry (interventionalist on call) who did not believe she warranted emergent intervention at this time as long as her pain continued to improve.   If she has non-obstructive disease, I query whether coronary vasospasm could have been at play, as she has little to no traditional risk factors for obstructive CAD. CTA has also ruled out dissection or other aortic pathology.   #Myocardial Injury, NSTEMI -- Will maintain on heparin gtt tonight -- ASA 325 given, will continue ASA 81mg  daily for now.  -- Lipids and a1c to risk stratify -- Complete echocardiogram to ensure no new wall motion abnormalities.  -- No beta blocker given baseline bradycardia.  -- Add atorvastatin 40mg  will check LFTs at baseline.   #Anxiety/Depression -- Continue home gabapentin, lamictal, vorioxetine.  -- Ativan 1mg  daily PRN.  -- Melatonin for sleep.   #Hypothyroidism -- Recheck TSH, Free T4 -- continue home synthyroid.   Severity of Illness: The appropriate patient status for this patient is INPATIENT. Inpatient status is judged to be reasonable and necessary in order to provide the required intensity of service to ensure the patient's safety. The patient's presenting symptoms, physical exam findings, and initial radiographic and laboratory data in the context of their chronic comorbidities is felt to place them at high risk for further clinical deterioration. Furthermore, it is not anticipated that the patient will be medically stable for discharge from the hospital within 2 midnights of admission. The following factors support the patient status of inpatient.   " The patient's presenting symptoms include chest pain. " The worrisome physical exam findings include n/a " The initial radiographic and laboratory data are worrisome because of  elevated troponin " The chronic co-morbidities include hypothyroidism   * I certify that at the point of admission it is my clinical judgment that the patient will require inpatient hospital care spanning beyond 2 midnights from the point of admission due to high intensity of service, high risk for further deterioration and high frequency of surveillance required.*    For questions or updates, please contact [facility] Please consult [email or web address] for contact info under    Signed, Laurell JosephsLauren K Truby, MD  10/01/2018 10:24 PM

## 2018-10-02 ENCOUNTER — Inpatient Hospital Stay (HOSPITAL_COMMUNITY): Payer: Federal, State, Local not specified - PPO

## 2018-10-02 ENCOUNTER — Encounter (HOSPITAL_COMMUNITY): Payer: Self-pay | Admitting: Student

## 2018-10-02 ENCOUNTER — Other Ambulatory Visit: Payer: Self-pay

## 2018-10-02 DIAGNOSIS — E785 Hyperlipidemia, unspecified: Secondary | ICD-10-CM | POA: Diagnosis not present

## 2018-10-02 DIAGNOSIS — R079 Chest pain, unspecified: Secondary | ICD-10-CM | POA: Diagnosis not present

## 2018-10-02 DIAGNOSIS — I214 Non-ST elevation (NSTEMI) myocardial infarction: Principal | ICD-10-CM

## 2018-10-02 DIAGNOSIS — E876 Hypokalemia: Secondary | ICD-10-CM

## 2018-10-02 DIAGNOSIS — I959 Hypotension, unspecified: Secondary | ICD-10-CM | POA: Diagnosis not present

## 2018-10-02 LAB — BASIC METABOLIC PANEL
Anion gap: 9 (ref 5–15)
BUN: 7 mg/dL (ref 6–20)
CO2: 24 mmol/L (ref 22–32)
Calcium: 8.3 mg/dL — ABNORMAL LOW (ref 8.9–10.3)
Chloride: 104 mmol/L (ref 98–111)
Creatinine, Ser: 0.71 mg/dL (ref 0.44–1.00)
GFR calc Af Amer: >60 mL/min (ref >60)
GFR calc non Af Amer: >60 mL/min (ref >60)
Glucose, Bld: 128 mg/dL — ABNORMAL HIGH (ref 70–99)
Potassium: 3.2 mmol/L — ABNORMAL LOW (ref 3.5–5.1)
Sodium: 137 mmol/L (ref 135–145)

## 2018-10-02 LAB — BRAIN NATRIURETIC PEPTIDE: B Natriuretic Peptide: 262.7 pg/mL — ABNORMAL HIGH (ref 0.0–100.0)

## 2018-10-02 LAB — HEPATIC FUNCTION PANEL
ALT: 13 U/L (ref 0–44)
AST: 19 U/L (ref 15–41)
Albumin: 3.6 g/dL (ref 3.5–5.0)
Alkaline Phosphatase: 44 U/L (ref 38–126)
Bilirubin, Direct: 0.1 mg/dL (ref 0.0–0.2)
Indirect Bilirubin: 0.4 mg/dL (ref 0.3–0.9)
Total Bilirubin: 0.5 mg/dL (ref 0.3–1.2)
Total Protein: 6.1 g/dL — ABNORMAL LOW (ref 6.5–8.1)

## 2018-10-02 LAB — HEPARIN LEVEL (UNFRACTIONATED)
Heparin Unfractionated: 0.23 IU/mL — ABNORMAL LOW (ref 0.30–0.70)
Heparin Unfractionated: 0.78 IU/mL — ABNORMAL HIGH (ref 0.30–0.70)
Heparin Unfractionated: 0.36 IU/mL (ref 0.30–0.70)

## 2018-10-02 LAB — LIPID PANEL
Cholesterol: 178 mg/dL (ref 0–200)
HDL: 51 mg/dL (ref >40)
LDL Cholesterol: 114 mg/dL — ABNORMAL HIGH (ref 0–99)
Total CHOL/HDL Ratio: 3.5 RATIO
Triglycerides: 65 mg/dL (ref <150)
VLDL: 13 mg/dL (ref 0–40)

## 2018-10-02 LAB — MAGNESIUM: Magnesium: 1.9 mg/dL (ref 1.7–2.4)

## 2018-10-02 LAB — ECHOCARDIOGRAM COMPLETE
Height: 66 in
Weight: 2719.59 oz

## 2018-10-02 LAB — HIV ANTIBODY (ROUTINE TESTING W REFLEX): HIV Screen 4th Generation wRfx: NONREACTIVE

## 2018-10-02 LAB — T4, FREE: Free T4: 0.95 ng/dL (ref 0.61–1.12)

## 2018-10-02 LAB — TROPONIN I (HIGH SENSITIVITY): Troponin I (High Sensitivity): 391 ng/L (ref <18)

## 2018-10-02 LAB — MRSA PCR SCREENING: MRSA by PCR: NEGATIVE

## 2018-10-02 LAB — HEMOGLOBIN A1C
Hgb A1c MFr Bld: 5.2 % (ref 4.8–5.6)
Mean Plasma Glucose: 102.54 mg/dL

## 2018-10-02 LAB — TSH: TSH: 1.772 u[IU]/mL (ref 0.350–4.500)

## 2018-10-02 MED ORDER — SODIUM CHLORIDE 0.9% FLUSH
3.0000 mL | Freq: Two times a day (BID) | INTRAVENOUS | Status: DC
  Administered 2018-10-02 – 2018-10-03 (×2): 3 mL via INTRAVENOUS

## 2018-10-02 MED ORDER — SODIUM CHLORIDE 0.9 % IV SOLN
INTRAVENOUS | Status: DC
  Administered 2018-10-02: 22:00:00 via INTRAVENOUS

## 2018-10-02 MED ORDER — SODIUM CHLORIDE 0.9 % IV SOLN
INTRAVENOUS | Status: DC
  Administered 2018-10-03: 12:00:00 via INTRAVENOUS

## 2018-10-02 MED ORDER — SODIUM CHLORIDE 0.9% FLUSH: 3.0000 mL | INTRAVENOUS | Status: DC | PRN

## 2018-10-02 MED ORDER — SODIUM CHLORIDE 0.9 % IV SOLN: 250.0000 mL | INTRAVENOUS | Status: DC | PRN

## 2018-10-02 MED ORDER — POTASSIUM CHLORIDE CRYS ER 20 MEQ PO TBCR
40.0000 meq | EXTENDED_RELEASE_TABLET | ORAL | Status: AC
  Administered 2018-10-02 (×2): 40 meq via ORAL
  Filled 2018-10-02 (×2): qty 2

## 2018-10-02 MED ORDER — MAGNESIUM SULFATE IN D5W 1-5 GM/100ML-% IV SOLN
1.0000 g | Freq: Once | INTRAVENOUS | Status: AC
  Administered 2018-10-02: 1 g via INTRAVENOUS
  Filled 2018-10-02: qty 100

## 2018-10-02 MED ORDER — ASPIRIN 81 MG PO CHEW
81.0000 mg | CHEWABLE_TABLET | ORAL | Status: AC
  Administered 2018-10-03: 81 mg via ORAL
  Filled 2018-10-02: qty 1

## 2018-10-02 MED ORDER — ASPIRIN EC 81 MG PO TBEC: 81.0000 mg | DELAYED_RELEASE_TABLET | Freq: Every day | ORAL | Status: DC

## 2018-10-02 MED ORDER — HEPARIN BOLUS VIA INFUSION
2000.0000 [IU] | Freq: Once | INTRAVENOUS | Status: AC
  Administered 2018-10-02: 2000 [IU] via INTRAVENOUS
  Filled 2018-10-02: qty 2000

## 2018-10-02 NOTE — Progress Notes (Signed)
  Echocardiogram 2D Echocardiogram has been performed.  Ariel Ramirez 10/02/2018, 2:05 PM

## 2018-10-02 NOTE — Progress Notes (Addendum)
Progress Note  Patient Name: Ariel Ramirez Date of Encounter: 10/02/2018  Primary Cardiologist: Dorris Carnes, MD  Subjective   Chest pain free on low dose NTG. BP a little soft - last one in [facility] in the 80s but this AM 90/52 and no dizziness. No recent stressors.  Has some "awareness" in her chest that something has happened but feeling much better overall. No SOB.  Inpatient Medications    Scheduled Meds: . aspirin EC  81 mg Oral Daily  . atorvastatin  40 mg Oral q1800  . gabapentin  300 mg Oral BID  . lamoTRIgine  100 mg Oral BID  . [START ON 10/03/2018] levothyroxine  112 mcg Oral QODAY  . [START ON 10/03/2018] levothyroxine  125 mcg Oral QODAY  . Melatonin  3 mg Oral QHS  .  morphine injection  1 mg Intravenous Once  . pantoprazole  40 mg Oral Daily  . vortioxetine HBr  10 mg Oral Daily   Continuous Infusions: . heparin 1,100 Units/hr (10/02/18 0500)  . nitroGLYCERIN 5 mcg/min (10/02/18 0440)   PRN Meds: acetaminophen, LORazepam, ondansetron (ZOFRAN) IV   Vital Signs    Vitals:   10/01/18 2300 10/01/18 2315 10/02/18 0000 10/02/18 0400  BP: 118/70 (!) 105/43 110/64 (!) 87/42  Pulse: (!) 53 (!) 54 72 62  Resp: 12 10 16 11   Temp:   98.9 F (37.2 C) 98.4 F (36.9 C)  TempSrc:   Oral Oral  SpO2: 98% 99% 99%   Weight:   77.1 kg   Height:   5\' 6"  (1.676 m)     Intake/Output Summary (Last 24 hours) at 10/02/2018 0749 Last data filed at 10/02/2018 0500 Gross per 24 hour  Intake 439.98 ml  Output -  Net 439.98 ml   Last 3 Weights 10/02/2018 10/01/2018 09/08/2018  Weight (lbs) 169 lb 15.6 oz 170 lb 169 lb  Weight (kg) 77.1 kg 77.111 kg 76.658 kg     Telemetry    NSR - Personally Reviewed  Physical Exam   GEN: No acute distress, calm WF. Lying flat in bed without symptoms. HEENT: Normocephalic, atraumatic, sclera non-icteric. Neck: No JVD or bruits. Cardiac: RRR no murmurs, rubs, or gallops.  Radials/DP/PT 1+ and equal bilaterally.  Respiratory: Clear to  auscultation bilaterally. Breathing is unlabored. GI: Soft, nontender, non-distended, BS +x 4. MS: no deformity. Extremities: No clubbing or cyanosis. No edema. Distal pedal pulses are 2+ and equal bilaterally. Neuro:  AAOx3. Follows commands. Psych:  Responds to questions appropriately with a normal affect.  Labs    Chemistry Recent Labs  Lab 10/01/18 1420 10/02/18 0241  NA 140 137  K 3.5 3.2*  CL 108 104  CO2 23 24  GLUCOSE 104* 128*  BUN 11 7  CREATININE 0.94 0.71  CALCIUM 8.6* 8.3*  PROT  --  6.1*  ALBUMIN  --  3.6  AST  --  19  ALT  --  13  ALKPHOS  --  44  BILITOT  --  0.5  GFRNONAA >60 >60  GFRAA >60 >60  ANIONGAP 9 9     Hematology Recent Labs  Lab 10/01/18 1420  WBC 5.3  RBC 4.40  HGB 12.6  HCT 38.5  MCV 87.5  MCH 28.6  MCHC 32.7  RDW 13.6  PLT 255    Cardiac EnzymesNo results for input(s): TROPONINI in the last 168 hours. No results for input(s): TROPIPOC in the last 168 hours.   BNP Recent Labs  Lab 10/02/18 0241  BNP 262.7*     DDimer No results for input(s): DDIMER in the last 168 hours.   Radiology    Dg Chest Portable 1 View  Result Date: 10/01/2018 CLINICAL DATA:  Chest pain and shortness of breath. Ex-smoker. EXAM: PORTABLE CHEST 1 VIEW COMPARISON:  12/16/2016. FINDINGS: Normal sized heart. Clear lungs with normal vascularity. Unremarkable bones. IMPRESSION: Normal examination. Electronically Signed   By: Beckie SaltsSteven  Reid M.D.   On: 10/01/2018 15:01   Ct Angio Chest/abd/pel For Dissection W And/or W/wo  Result Date: 10/01/2018 CLINICAL DATA:  58 year old female with chest pain. EXAM: CT ANGIOGRAPHY CHEST, ABDOMEN AND PELVIS TECHNIQUE: Multidetector CT imaging through the chest, abdomen and pelvis was performed using the standard protocol during bolus administration of intravenous contrast. Multiplanar reconstructed images and MIPs were obtained and reviewed to evaluate the vascular anatomy. CONTRAST:  100mL OMNIPAQUE IOHEXOL 350 MG/ML  SOLN COMPARISON:  Chest radiograph dated 10/01/2018 and CT of the abdomen pelvis dated 09/29/2011 FINDINGS: CTA CHEST FINDINGS Cardiovascular: There is mild dilatation of the left cardiac chambers. No pericardial effusion. The thoracic aorta is unremarkable. The origins of the great vessels of the aortic arch appear patent as visualized. Mild dilatation of the main pulmonary trunk which may represent a degree of pulmonary hypertension. Clinical correlation is recommended. No CT evidence of pulmonary embolism. Mediastinum/Nodes: No hilar or mediastinal adenopathy. There is a small hiatal hernia. The esophagus and the thyroid gland are grossly unremarkable. No mediastinal fluid collection. Lungs/Pleura: The lungs are clear. There is no pleural effusion or pneumothorax. The central airways are patent. Musculoskeletal: No chest wall abnormality. No acute or significant osseous findings. Review of the MIP images confirms the above findings. CTA ABDOMEN AND PELVIS FINDINGS VASCULAR Aorta: Normal caliber aorta without aneurysm, dissection, vasculitis or significant stenosis. Celiac: Patent without evidence of aneurysm, dissection, vasculitis or significant stenosis. SMA: Patent without evidence of aneurysm, dissection, vasculitis or significant stenosis. Renals: Both renal arteries are patent without evidence of aneurysm, dissection, vasculitis, fibromuscular dysplasia or significant stenosis. IMA: Patent without evidence of aneurysm, dissection, vasculitis or significant stenosis. Inflow: Patent without evidence of aneurysm, dissection, vasculitis or significant stenosis. Veins: No obvious venous abnormality within the limitations of this arterial phase study. Review of the MIP images confirms the above findings. NON-VASCULAR No intra-abdominal free air or free fluid. Hepatobiliary: The liver is unremarkable. No intrahepatic biliary ductal dilatation. Cholecystectomy. Pancreas: Unremarkable. No pancreatic ductal  dilatation or surrounding inflammatory changes. Spleen: Normal in size without focal abnormality. Adrenals/Urinary Tract: Adrenal glands are unremarkable. Kidneys are normal, without renal calculi, focal lesion, or hydronephrosis. Bladder is unremarkable. Stomach/Bowel: There is sigmoid diverticulosis without active inflammatory changes. There is moderate stool throughout the colon. There is no bowel obstruction or active inflammation. There is a 2 cm duodenal diverticulum. The appendix is normal. Lymphatic: No adenopathy. Reproductive: Hysterectomy. No pelvic mass. Other: None Musculoskeletal: Degenerative changes primarily at L5-S1. No acute osseous pathology. Review of the MIP images confirms the above findings. IMPRESSION: 1. No acute intrathoracic, abdominal, or pelvic pathology. No aortic dissection or aneurysm. No CT evidence of pulmonary embolism. 2. Colonic diverticulosis. No bowel obstruction or active inflammation. Normal appendix. Electronically Signed   By: Elgie CollardArash  Radparvar M.D.   On: 10/01/2018 20:51    Cardiac Studies   Stress echo 06/2013 Study Conclusions   - Stress ECG conclusions: There were no stress arrhythmias  or conduction abnormalities. The stress ECG was normal.  - Staged echo: There was no echocardiographic evidence for  stress-induced ischemia.  Impressions:  - Stress echocardiogram with no chest pain; SBP decreased  from 184 in Stage 2 to 152 in Stage 3 consistent with  hypotensive response; no ST changes and no obvious  stress-induced wall motion abnormalities (note question  apical wall motion abnormality on initial saved images;  however on review of additional images, wall motion  appeared to be normal). Clinical correlation advised given  BP response.   Patient Profile     58 y.o. female with hypothyroidism, anxiety/depression, GERD s/p recent esophageal dilatation who presented with sudden onset of chest pain and NSTEMI. She had prior  evaluation for chest pain with stress echo in 2015 negative for ischemia but with hypotensive response, and nuclear stress test in 09/2018 negative for ischemia, EF 69%. CTA for dissection unrevealing and no evidence of PE. HsTroponin peak 959.  Assessment & Plan    1. NSTEMI - etiology unclear, question ACS r/t blockage/CAD versus coronary vasospasm. She has constant stress of caring for mother with dementia but no recent acute events. 2D echo pending. Initial EKG yesterday showed very subtle ST upsloping in avL, but f/u tracing last night showed NSR with nonspecific STT changes without ST elevation (similar to 09/08/18). I have asked nurse to obtain f/u EKG this AM. Will discuss timing of cath with MD. Risks and benefits of cardiac catheterization have been discussed with the patient.  These include bleeding, infection, kidney damage, stroke, heart attack, death.  The patient understands these risks and is willing to proceed. Continue ASA, heparin, new statin. No BB given sinus bradycardia. May need to stop IV NTG (low dose at this point, 5mcg) due to soft BP.  2. Hypokalemia - will give KCL 40meq x 2 today and check Mg level.  3. Mild hyperlipidemia - started on atorvastatin this admisson. If CAD present, consider increasing further to 80mg  nightly. If the patient is tolerating statin at time of follow-up appointment, would consider rechecking liver function/lipid panel in 6-8 weeks.  4. Mild hyperglycemia - Glucose 128. A1C wnl.  5. Hypothyroidism - TFTs wnl.  For questions or updates, please contact [facility] Please consult [email or web address] for contact info under Cardiology/STEMI.  Signed, Laurann Montanaayna N Dunn, PA-C 10/02/2018, 7:49 AM    Patient examined chart reviewed No murmur to explain low BP lungs clear no chest pain ECG no acute changes D/c iv nitro and hydrate echo this am to assess LV function and r/o RV infarct or effusion Agree with cath in am may eat now continue heparin Dr Tenny Crawoss  rounding tomorrow and can f/u with patient Orders written placed on cath schedule  [facility] Pauline Trainer

## 2018-10-02 NOTE — Plan of Care (Signed)
Patient is progressing towards care plan goals. 

## 2018-10-02 NOTE — Progress Notes (Addendum)
Pre-cath orders written under sign/held. Dr Johnsie Cancel has already started patient on standing IVF at 150ml/hr so have continued this rate pre-cath as well. Have placed pt on add-on board for Monday.  Addendum: f/u EKG reviewed, nonspecific STT changes persist inferiorly and V2-V6 but also some new TWI in I, avL, QTc lengthened as well, 531ms. Replete K as planned. Mg level returned 1.9, will give 1g mag sulfate. Asked Dr. Johnsie Cancel to review, no new recs. Continue plan as discussed.  Dayna Dunn PA-C

## 2018-10-02 NOTE — H&P (View-Only) (Signed)
Pre-cath orders written under sign/held. Dr Nishan has already started patient on standing IVF at 100ml/hr so have continued this rate pre-cath as well. Have placed pt on add-on board for Monday.  Addendum: f/u EKG reviewed, nonspecific STT changes persist inferiorly and V2-V6 but also some new TWI in I, avL, QTc lengthened as well, 504ms. Replete K as planned. Mg level returned 1.9, will give 1g mag sulfate. Asked Dr. Nishan to review, no new recs. Continue plan as discussed.  Dayna Dunn PA-C   

## 2018-10-02 NOTE — Progress Notes (Signed)
ANTICOAGULATION CONSULT NOTE Pharmacy Consult for heparin Indication: chest pain/ACS  No Known Allergies  Patient Measurements: Height: 5\' 6"  (167.6 cm) Weight: 169 lb 15.6 oz (77.1 kg) IBW/kg (Calculated) : 59.3 Heparin Dosing Weight: 75 kg  Vital Signs: Temp: 98.9 F (37.2 C) (07/19 0000) Temp Source: Oral (07/19 0000) BP: 110/64 (07/19 0000) Pulse Rate: 72 (07/19 0000)  Labs: Recent Labs    10/01/18 1420 10/01/18 1630 10/01/18 2054 10/02/18 0241  HGB 12.6  --   --   --   HCT 38.5  --   --   --   PLT 255  --   --   --   HEPARINUNFRC  --   --   --  0.23*  CREATININE 0.94  --   --  0.71  TROPONINIHS 101* 824* 959* 391*    Estimated Creatinine Clearance: 82.3 mL/min (by C-G formula based on SCr of 0.71 mg/dL).  Assessment: 58 y.o. female with chest pain for heparin    Goal of Therapy:  Heparin level 0.3-0.7 units/ml Monitor platelets by anticoagulation protocol: Yes   Plan:  Heparin 2000 units IV bolus, then increase heparin 1100 units/hr Check heparin level in 6 hours.   Phillis Knack, PharmD, BCPS  10/02/2018,4:24 AM

## 2018-10-02 NOTE — Progress Notes (Signed)
ANTICOAGULATION CONSULT NOTE - Follow Up Consult  Pharmacy Consult for heparin Indication: chest pain/ACS  No Known Allergies  Patient Measurements: Height: 5\' 6"  (167.6 cm) Weight: 169 lb 15.6 oz (77.1 kg) IBW/kg (Calculated) : 59.3 Heparin Dosing Weight: 75 kg  Vital Signs: Temp: 98.5 F (36.9 C) (07/19 1045) Temp Source: Oral (07/19 1045) BP: 107/61 (07/19 1045) Pulse Rate: 62 (07/19 0400)  Labs: Recent Labs    10/01/18 1420 10/01/18 1630 10/01/18 2054 10/02/18 0241 10/02/18 0525 10/02/18 1208  HGB 12.6  --   --   --   --   --   HCT 38.5  --   --   --   --   --   PLT 255  --   --   --   --   --   HEPARINUNFRC  --   --   --  0.23* 0.78* 0.36  CREATININE 0.94  --   --  0.71  --   --   TROPONINIHS 101* 824* 959* 391*  --   --     Estimated Creatinine Clearance: 82.3 mL/min (by C-G formula based on SCr of 0.71 mg/dL).   Medical History: Past Medical History:  Diagnosis Date  . Anxiety   . Arthritis   . Chest pain, atypical 2015   Echo and stress echo were okay  . Depression   . Hypothyroid     Assessment: 67 yof presenting with sudden onset CP with SOB. No AC PTA.   Heparin drip  1100 uts/hr  HL 0.36 at goal.  No bleeding noted.   Goal of Therapy:  Heparin level 0.3-0.7 units/ml Monitor platelets by anticoagulation protocol: Yes   Plan:  Continue Heparin drip 1100 uts/hr  Daily HL, CBC  Plan cath tomorrow    Bonnita Nasuti Pharm.D. CPP, BCPS Clinical Pharmacist [phone] 10/02/2018 2:19 PM

## 2018-10-03 ENCOUNTER — Telehealth: Payer: Self-pay

## 2018-10-03 ENCOUNTER — Encounter (HOSPITAL_COMMUNITY)
Admission: EM | Disposition: A | Discharge status: Home / Self Care | Payer: Self-pay | Source: Home / Self Care | Attending: Cardiology

## 2018-10-03 ENCOUNTER — Other Ambulatory Visit: Payer: Self-pay

## 2018-10-03 DIAGNOSIS — I214 Non-ST elevation (NSTEMI) myocardial infarction: Secondary | ICD-10-CM | POA: Diagnosis not present

## 2018-10-03 DIAGNOSIS — E785 Hyperlipidemia, unspecified: Secondary | ICD-10-CM | POA: Diagnosis not present

## 2018-10-03 DIAGNOSIS — E876 Hypokalemia: Secondary | ICD-10-CM | POA: Diagnosis not present

## 2018-10-03 DIAGNOSIS — I959 Hypotension, unspecified: Secondary | ICD-10-CM | POA: Diagnosis not present

## 2018-10-03 HISTORY — PX: LEFT HEART CATH AND CORONARY ANGIOGRAPHY: CATH118249

## 2018-10-03 LAB — BASIC METABOLIC PANEL
Anion gap: 5 (ref 5–15)
BUN: <5 mg/dL — ABNORMAL LOW (ref 6–20)
CO2: 24 mmol/L (ref 22–32)
Calcium: 8.2 mg/dL — ABNORMAL LOW (ref 8.9–10.3)
Chloride: 111 mmol/L (ref 98–111)
Creatinine, Ser: 0.85 mg/dL (ref 0.44–1.00)
GFR calc Af Amer: >60 mL/min (ref >60)
GFR calc non Af Amer: >60 mL/min (ref >60)
Glucose, Bld: 116 mg/dL — ABNORMAL HIGH (ref 70–99)
Potassium: 4.4 mmol/L (ref 3.5–5.1)
Sodium: 140 mmol/L (ref 135–145)

## 2018-10-03 LAB — CBC
HCT: 35.9 % — ABNORMAL LOW (ref 36.0–46.0)
Hemoglobin: 11.5 g/dL — ABNORMAL LOW (ref 12.0–15.0)
MCH: 29 pg (ref 26.0–34.0)
MCHC: 32 g/dL (ref 30.0–36.0)
MCV: 90.4 fL (ref 80.0–100.0)
Platelets: 219 10*3/uL (ref 150–400)
RBC: 3.97 MIL/uL (ref 3.87–5.11)
RDW: 14.1 % (ref 11.5–15.5)
WBC: 5.7 10*3/uL (ref 4.0–10.5)
nRBC: 0 % (ref 0.0–0.2)

## 2018-10-03 LAB — HEPARIN LEVEL (UNFRACTIONATED): Heparin Unfractionated: 0.28 IU/mL — ABNORMAL LOW (ref 0.30–0.70)

## 2018-10-03 LAB — MAGNESIUM: Magnesium: 2.1 mg/dL (ref 1.7–2.4)

## 2018-10-03 SURGERY — LEFT HEART CATH AND CORONARY ANGIOGRAPHY
Anesthesia: LOCAL

## 2018-10-03 MED ORDER — HEPARIN (PORCINE) IN NACL 1000-0.9 UT/500ML-% IV SOLN
INTRAVENOUS | Status: DC | PRN
  Administered 2018-10-03: 500 mL

## 2018-10-03 MED ORDER — ACETAMINOPHEN 325 MG PO TABS
650.0000 mg | ORAL_TABLET | ORAL | Status: DC | PRN
  Administered 2018-10-03: 650 mg via ORAL

## 2018-10-03 MED ORDER — IOHEXOL 350 MG/ML SOLN
INTRAVENOUS | Status: DC | PRN
  Administered 2018-10-03: 55 mL via INTRA_ARTERIAL

## 2018-10-03 MED ORDER — ASPIRIN 81 MG PO CHEW: 81.0000 mg | CHEWABLE_TABLET | Freq: Every day | ORAL | Status: DC

## 2018-10-03 MED ORDER — FENTANYL CITRATE (PF) 100 MCG/2ML IJ SOLN
INTRAMUSCULAR | Status: AC
  Filled 2018-10-03: qty 2

## 2018-10-03 MED ORDER — SODIUM CHLORIDE 0.9 % IV SOLN: INTRAVENOUS | Status: AC

## 2018-10-03 MED ORDER — HEPARIN (PORCINE) IN NACL 1000-0.9 UT/500ML-% IV SOLN
INTRAVENOUS | Status: AC
  Filled 2018-10-03: qty 1000

## 2018-10-03 MED ORDER — NITROGLYCERIN 1 MG/10 ML FOR IR/CATH LAB
INTRA_ARTERIAL | Status: AC
  Filled 2018-10-03: qty 10

## 2018-10-03 MED ORDER — MORPHINE SULFATE (PF) 2 MG/ML IV SOLN: 2.0000 mg | INTRAVENOUS | Status: DC | PRN

## 2018-10-03 MED ORDER — HEPARIN SODIUM (PORCINE) 1000 UNIT/ML IJ SOLN
INTRAMUSCULAR | Status: DC | PRN
  Administered 2018-10-03: 4000 [IU] via INTRAVENOUS

## 2018-10-03 MED ORDER — VERAPAMIL HCL 2.5 MG/ML IV SOLN
INTRA_ARTERIAL | Status: DC | PRN
  Administered 2018-10-03: 5 mL via INTRA_ARTERIAL

## 2018-10-03 MED ORDER — DILTIAZEM HCL 30 MG PO TABS: 30.0000 mg | ORAL_TABLET | Freq: Two times a day (BID) | ORAL | 6 refills | Status: DC

## 2018-10-03 MED ORDER — SODIUM CHLORIDE 0.9% FLUSH: 3.0000 mL | Freq: Two times a day (BID) | INTRAVENOUS | Status: DC

## 2018-10-03 MED ORDER — SODIUM CHLORIDE 0.9 % IV SOLN: 250.0000 mL | INTRAVENOUS | Status: DC | PRN

## 2018-10-03 MED ORDER — LIDOCAINE HCL (PF) 1 % IJ SOLN
INTRAMUSCULAR | Status: DC | PRN
  Administered 2018-10-03: 2 mL

## 2018-10-03 MED ORDER — ATORVASTATIN CALCIUM 10 MG PO TABS
20.0000 mg | ORAL_TABLET | Freq: Every day | ORAL | Status: DC
  Administered 2018-10-03: 20 mg via ORAL
  Filled 2018-10-03: qty 2

## 2018-10-03 MED ORDER — ATORVASTATIN CALCIUM 20 MG PO TABS: 20.0000 mg | ORAL_TABLET | Freq: Every day | ORAL | 3 refills | Status: DC

## 2018-10-03 MED ORDER — ONDANSETRON HCL 4 MG/2ML IJ SOLN: 4.0000 mg | Freq: Four times a day (QID) | INTRAMUSCULAR | Status: DC | PRN

## 2018-10-03 MED ORDER — DILTIAZEM HCL 60 MG PO TABS: 30.0000 mg | ORAL_TABLET | Freq: Two times a day (BID) | ORAL | Status: DC

## 2018-10-03 MED ORDER — HYDRALAZINE HCL 20 MG/ML IJ SOLN: 10.0000 mg | INTRAMUSCULAR | Status: DC | PRN

## 2018-10-03 MED ORDER — FENTANYL CITRATE (PF) 100 MCG/2ML IJ SOLN
INTRAMUSCULAR | Status: DC | PRN
  Administered 2018-10-03 (×2): 25 ug via INTRAVENOUS

## 2018-10-03 MED ORDER — LABETALOL HCL 5 MG/ML IV SOLN: 10.0000 mg | INTRAVENOUS | Status: DC | PRN

## 2018-10-03 MED ORDER — MIDAZOLAM HCL 2 MG/2ML IJ SOLN
INTRAMUSCULAR | Status: DC | PRN
  Administered 2018-10-03 (×2): 1 mg via INTRAVENOUS

## 2018-10-03 MED ORDER — MIDAZOLAM HCL 2 MG/2ML IJ SOLN
INTRAMUSCULAR | Status: AC
  Filled 2018-10-03: qty 2

## 2018-10-03 MED ORDER — SODIUM CHLORIDE 0.9% FLUSH: 3.0000 mL | INTRAVENOUS | Status: DC | PRN

## 2018-10-03 MED ORDER — LIDOCAINE HCL (PF) 1 % IJ SOLN
INTRAMUSCULAR | Status: AC
  Filled 2018-10-03: qty 30

## 2018-10-03 MED ORDER — VERAPAMIL HCL 2.5 MG/ML IV SOLN
INTRAVENOUS | Status: AC
  Filled 2018-10-03: qty 2

## 2018-10-03 SURGICAL SUPPLY — 11 items
CATH INFINITI 5 FR JL3.5 (CATHETERS) ×1 IMPLANT
CATH OPTITORQUE TIG 4.0 5F (CATHETERS) ×1 IMPLANT
DEVICE RAD COMP TR BAND LRG (VASCULAR PRODUCTS) ×1 IMPLANT
GLIDESHEATH SLEND A-KIT 6F 22G (SHEATH) ×1 IMPLANT
GUIDEWIRE INQWIRE 1.5J.035X260 (WIRE) IMPLANT
INQWIRE 1.5J .035X260CM (WIRE) ×2
KIT HEART LEFT (KITS) ×2 IMPLANT
PACK CARDIAC CATHETERIZATION (CUSTOM PROCEDURE TRAY) ×2 IMPLANT
TRANSDUCER W/STOPCOCK (MISCELLANEOUS) ×2 IMPLANT
TUBING CIL FLEX 10 FLL-RA (TUBING) ×2 IMPLANT
WIRE HI TORQ VERSACORE-J 145CM (WIRE) ×1 IMPLANT

## 2018-10-03 NOTE — Plan of Care (Signed)
Patient is progressing to meet goals of care.  

## 2018-10-03 NOTE — Discharge Summary (Signed)
Discharge Summary    Ariel Ramirez ID: Ariel Ramirez,  MRN: [id number], DOB/AGE: 1960/11/07 58 y.o.  Admit date: 10/01/2018 Discharge date: 10/03/2018  Primary Care Provider: Joette CatchingNyland, Leonard Primary Cardiologist: Dietrich PatesPaula Ross, MD  Discharge Diagnoses    Principal Problem:   NSTEMI (non-ST elevated myocardial infarction) [facility](HCC) Active Problems:   Hypothyroid   Hypokalemia   Mild hyperlipidemia   Allergies No Known Allergies  Diagnostic Studies/Procedures    Left heart catheterization 10/03/2018: IMPRESSION: Ariel Ramirez  had normal coronary arteries and normal filling pressures.  I believe her chest pain was noncardiac potentially related to coronary vasospasm.  The sheath was removed and a TR band was placed on the right wrist to achieve patent hemostasis.  The Ariel Ramirez left lab in stable condition.  She can be discharged home later today with outpatient follow-up.  Echocardiogram 10/02/2018: 1. The left ventricle has normal systolic function, with an ejection fraction of 55-60%. The cavity size was normal. Left ventricular diastolic parameters were normal. 2. Small area of posterior basal hypokinesis. 3. The right ventricle has normal systolic function. The cavity was normal. There is no increase in right ventricular wall thickness. 4. Mild thickening of the mitral valve leaflet. 5. The aortic valve is tricuspid. Mild thickening of the aortic valve. 6. The aortic root is normal in size and structure. _____________   History of Present Illness     58 y.o. female with hypothyroidism, anxiety/depression, and GERD s/p recent esophageal dilatation who presents with sudden onset chest pain. Ariel Ramirez was in her usual state of health until the afternoon of 10/01/2018 when, while seated, eating lunch, after doing light housework, she developed sudden onset 10/10 substernal chest pressure. It radiated to her back and shoulder, but not to jaw or arms. She doesn't remember feeling  diaphoretic or nauseous. She describes the pain as a pressure or squeezing like sensation that was very different from the pain she had previously experienced. She got in the car and drove to a nearby fire station where she was given 325 of ASA and [facility].   Upon arrival, Ariel Ramirez's CP was a 4/10 after ASA and SLN x 2. Initial labs were notable for a hsTn of 101 -> 824 -> 959. Initial EKG at triage with sub mm STE in AVL and I, which resolved on subsequent EKGs. Nitro gtt was started and uptitrated to 50 mcg with improvement in her pain to 3/10. CTA ruled out aortic pathology.  Of note, she was recently seen by cardiology for concerns of chest pain and increasing shortness of breath. She underwent a nuclear stress on 09/28/18 with normal perfusion, normal LV systolic function, and a calculated EF of 69%. EKG was without ST changes. She did experience dyspnea during the study.   On review of systems, she denies fever, chills, cough. Has had no N/V/D.     Hospital Course     Consultants: None     1. NSTEMI: Ariel Ramirez presented with chest pain. EKG with with non-specific ST-T wave abnormalities in inferior leads with new TWI in I and aVL. Trop trend 101>824>959>391. Echo 10/02/2018 with EF 55-60%, normal LV diastolic function, small area of posterior basal hypokinesis, no significant valvular abnormalities. NSTEMI suspicious for ACS 2/2 obstructive CAD vs coronary vasospasm. She was started on a heparin gtt and nitro gtt (d/c'd 2/2 hypotension). CP has resolved. Ariel Ramirez underwent LHC 10/03/2018 which revealed normal coronary anatomy. She was started on diltiazem 30mg  BID for possible vasospasm. She  was started on atorvastatin for risk factor modifications.  - Continue statin  - Continue diltiazem  2. HLD: LDL 114 this admission. She was started on atorvastatin 20mg  daily for risk factor modifications - Continue atorvastatin - Recheck FLP/LFTs in 6-8 weeks   3. Hypokalemia:  resolved with repletion. K 4.4 on the day of discharge.  - Continue to monitor routinely  4. Hypothyroidism: TSH wnl this admission - Continue levothyroxine  5. Depression:  - Continue vortioxetine  6. Migraines: - Continue lamotrigine  7. GERD:  - Continue pantoprazole _____________  Discharge Vitals Blood pressure 133/90, pulse 69, temperature 98.4 F (36.9 C), temperature source Oral, resp. rate 13, height 5\' 6"  (1.676 m), weight 77.1 kg, SpO2 100 %.  Filed Weights   10/01/18 1423 10/02/18 0000  Weight: 77.1 kg 77.1 kg    Labs & Radiologic Studies    CBC Recent Labs    10/01/18 1420 10/03/18 0224  WBC 5.3 5.7  HGB 12.6 11.5*  HCT 38.5 35.9*  MCV 87.5 90.4  PLT 255 219   Basic Metabolic Panel Recent Labs    21/30/8607/19/20 0241 10/02/18 0525 10/03/18 0224  NA 137  --  140  K 3.2*  --  4.4  CL 104  --  111  CO2 24  --  24  GLUCOSE 128*  --  116*  BUN 7  --  <5*  CREATININE 0.71  --  0.85  CALCIUM 8.3*  --  8.2*  MG  --  1.9 2.1   Liver Function Tests Recent Labs    10/02/18 0241  AST 19  ALT 13  ALKPHOS 44  BILITOT 0.5  PROT 6.1*  ALBUMIN 3.6   No results for input(s): LIPASE, AMYLASE in the last 72 hours. Cardiac Enzymes No results for input(s): CKTOTAL, CKMB, CKMBINDEX, TROPONINI in the last 72 hours. BNP Invalid input(s): POCBNP D-Dimer No results for input(s): DDIMER in the last 72 hours. Hemoglobin A1C Recent Labs    10/02/18 0241  HGBA1C 5.2   Fasting Lipid Panel Recent Labs    10/02/18 0241  CHOL 178  HDL 51  LDLCALC 114*  TRIG 65  CHOLHDL 3.5   Thyroid Function Tests Recent Labs    10/02/18 0241  TSH 1.772   _____________  Nm Myocar Multi W/spect W/wall Motion / Ef  Result Date: 09/28/2018  There was no ST segment deviation noted during stress.  The study is normal.  This is a low risk study. No ischemia or scar.  Nuclear stress EF: 69%.    Dg Chest Portable 1 View  Result Date: 10/01/2018 CLINICAL DATA:   Chest pain and shortness of breath. Ex-smoker. EXAM: PORTABLE CHEST 1 VIEW COMPARISON:  12/16/2016. FINDINGS: Normal sized heart. Clear lungs with normal vascularity. Unremarkable bones. IMPRESSION: Normal examination. Electronically Signed   By: Beckie SaltsSteven  Reid M.D.   On: 10/01/2018 15:01   Ct Angio Chest/abd/pel For Dissection W And/or W/wo  Result Date: 10/01/2018 CLINICAL DATA:  58 year old female with chest pain. EXAM: CT ANGIOGRAPHY CHEST, ABDOMEN AND PELVIS TECHNIQUE: Multidetector CT imaging through the chest, abdomen and pelvis was performed using the standard protocol during bolus administration of intravenous contrast. Multiplanar reconstructed images and MIPs were obtained and reviewed to evaluate the vascular anatomy. CONTRAST:  100mL OMNIPAQUE IOHEXOL 350 MG/ML SOLN COMPARISON:  Chest radiograph dated 10/01/2018 and CT of the abdomen pelvis dated 09/29/2011 FINDINGS: CTA CHEST FINDINGS Cardiovascular: There is mild dilatation of the left cardiac chambers. No pericardial effusion. The thoracic aorta is  unremarkable. The origins of the great vessels of the aortic arch appear patent as visualized. Mild dilatation of the main pulmonary trunk which may represent a degree of pulmonary hypertension. Clinical correlation is recommended. No CT evidence of pulmonary embolism. Mediastinum/Nodes: No hilar or mediastinal adenopathy. There is a small hiatal hernia. The esophagus and the thyroid gland are grossly unremarkable. No mediastinal fluid collection. Lungs/Pleura: The lungs are clear. There is no pleural effusion or pneumothorax. The central airways are patent. Musculoskeletal: No chest wall abnormality. No acute or significant osseous findings. Review of the MIP images confirms the above findings. CTA ABDOMEN AND PELVIS FINDINGS VASCULAR Aorta: Normal caliber aorta without aneurysm, dissection, vasculitis or significant stenosis. Celiac: Patent without evidence of aneurysm, dissection, vasculitis or  significant stenosis. SMA: Patent without evidence of aneurysm, dissection, vasculitis or significant stenosis. Renals: Both renal arteries are patent without evidence of aneurysm, dissection, vasculitis, fibromuscular dysplasia or significant stenosis. IMA: Patent without evidence of aneurysm, dissection, vasculitis or significant stenosis. Inflow: Patent without evidence of aneurysm, dissection, vasculitis or significant stenosis. Veins: No obvious venous abnormality within the limitations of this arterial phase study. Review of the MIP images confirms the above findings. NON-VASCULAR No intra-abdominal free air or free fluid. Hepatobiliary: The liver is unremarkable. No intrahepatic biliary ductal dilatation. Cholecystectomy. Pancreas: Unremarkable. No pancreatic ductal dilatation or surrounding inflammatory changes. Spleen: Normal in size without focal abnormality. Adrenals/Urinary Tract: Adrenal glands are unremarkable. Kidneys are normal, without renal calculi, focal lesion, or hydronephrosis. Bladder is unremarkable. Stomach/Bowel: There is sigmoid diverticulosis without active inflammatory changes. There is moderate stool throughout the colon. There is no bowel obstruction or active inflammation. There is a 2 cm duodenal diverticulum. The appendix is normal. Lymphatic: No adenopathy. Reproductive: Hysterectomy. No pelvic mass. Other: None Musculoskeletal: Degenerative changes primarily at L5-S1. No acute osseous pathology. Review of the MIP images confirms the above findings. IMPRESSION: 1. No acute intrathoracic, abdominal, or pelvic pathology. No aortic dissection or aneurysm. No CT evidence of pulmonary embolism. 2. Colonic diverticulosis. No bowel obstruction or active inflammation. Normal appendix. Electronically Signed   By: Anner Crete M.D.   On: 10/01/2018 20:51   Disposition   Ariel Ramirez was seen and examined by Dr. Harrington Challenger who deemed Ariel Ramirez as stable for discharge. Follow-up has been arranged.  Discharge medications as listed below.   Follow-up Plans & Appointments    Follow-up Information    [place], Crista Luria, [place] Follow up on 10/11/2018.   Specialty: Cardiology Why: Please arrive 15 minutes early for your 10:45am post-hospital follow-up appointment Contact information: [place] [id number] [phone]            Discharge Medications   Allergies as of 10/03/2018   No Known Allergies     Medication List    TAKE these medications   atorvastatin 20 MG tablet Commonly known as: LIPITOR Take 1 tablet (20 mg total) by mouth daily at 6 PM.   clonazePAM 0.5 MG tablet Commonly known as: KLONOPIN Take 1 tablet by mouth 2 (two) times daily as needed. Reported on 09/19/2015   diltiazem 30 MG tablet Commonly known as: CARDIZEM Take 1 tablet (30 mg total) by mouth every 12 (twelve) hours.   estradiol 0.5 MG tablet Commonly known as: ESTRACE Take 0.5 mg by mouth daily.   gabapentin 300 MG capsule Commonly known as: NEURONTIN Take 300 mg by mouth 2 (two) times daily. Anxiety Attacks   hydrOXYzine 10 MG tablet Commonly known as: ATARAX/VISTARIL Take 10 mg  by mouth daily as needed for itching or anxiety.   lamoTRIgine 100 MG tablet Commonly known as: LAMICTAL Take 100 mg by mouth 2 (two) times daily.   levothyroxine 125 MCG tablet Commonly known as: SYNTHROID Take 125 mcg by mouth every other day. Alternate with 112 mcg   levothyroxine 112 MCG tablet Commonly known as: SYNTHROID Take 112 mcg by mouth every other day. Alternate with 125 mg   LORazepam 1 MG tablet Commonly known as: ATIVAN Take 1 mg by mouth daily as needed for anxiety.   Melatonin 10 MG Tabs Take 10 mg by mouth at bedtime.   pantoprazole 40 MG tablet Commonly known as: PROTONIX Take 40 mg by mouth daily.   Trintellix 10 MG Tabs tablet Generic drug: vortioxetine HBr Take 10 mg by mouth daily.          Outstanding Labs/Studies   None  Duration of  Discharge Encounter   Greater than 30 minutes including physician time.  Signed, Beatriz StallionKrista M. Kroeger PA-C 10/03/2018, 3:01 PM

## 2018-10-03 NOTE — Plan of Care (Signed)
  Problem: Education: Goal: Knowledge of General Education information will improve Description: Including pain rating scale, medication(s)/side effects and non-pharmacologic comfort measures 10/03/2018 0846 by Thressa Sheller, RN Outcome: Progressing 10/03/2018 0846 by Thressa Sheller, RN Outcome: Progressing   Problem: Health Behavior/Discharge Planning: Goal: Ability to manage health-related needs will improve 10/03/2018 0846 by Thressa Sheller, RN Outcome: Progressing 10/03/2018 0846 by Fabienne Bruns D, RN Outcome: Progressing   Problem: Clinical Measurements: Goal: Ability to maintain clinical measurements within normal limits will improve 10/03/2018 0846 by Thressa Sheller, RN Outcome: Progressing 10/03/2018 0846 by Fabienne Bruns D, RN Outcome: Progressing Goal: Will remain free from infection 10/03/2018 0846 by Thressa Sheller, RN Outcome: Progressing 10/03/2018 0846 by Fabienne Bruns D, RN Outcome: Progressing Goal: Diagnostic test results will improve 10/03/2018 0846 by Thressa Sheller, RN Outcome: Progressing 10/03/2018 0846 by Fabienne Bruns D, RN Outcome: Progressing Goal: Respiratory complications will improve 10/03/2018 0846 by Thressa Sheller, RN Outcome: Progressing 10/03/2018 0846 by Fabienne Bruns D, RN Outcome: Progressing Goal: Cardiovascular complication will be avoided 10/03/2018 0846 by Thressa Sheller, RN Outcome: Progressing 10/03/2018 0846 by Thressa Sheller, RN Outcome: Progressing   Problem: Activity: Goal: Risk for activity intolerance will decrease 10/03/2018 0846 by Thressa Sheller, RN Outcome: Progressing 10/03/2018 0846 by Fabienne Bruns D, RN Outcome: Progressing   Problem: Nutrition: Goal: Adequate nutrition will be maintained 10/03/2018 0846 by Thressa Sheller, RN Outcome: Progressing 10/03/2018 0846 by Fabienne Bruns D, RN Outcome: Progressing   Problem: Coping: Goal: Level of anxiety will decrease 10/03/2018 0846 by Thressa Sheller, RN Outcome:  Progressing 10/03/2018 0846 by Fabienne Bruns D, RN Outcome: Progressing   Problem: Elimination: Goal: Will not experience complications related to bowel motility 10/03/2018 0846 by Thressa Sheller, RN Outcome: Progressing 10/03/2018 0846 by Fabienne Bruns D, RN Outcome: Progressing Goal: Will not experience complications related to urinary retention 10/03/2018 0846 by Thressa Sheller, RN Outcome: Progressing 10/03/2018 0846 by Fabienne Bruns D, RN Outcome: Progressing   Problem: Pain Managment: Goal: General experience of comfort will improve 10/03/2018 0846 by Thressa Sheller, RN Outcome: Progressing 10/03/2018 0846 by Fabienne Bruns D, RN Outcome: Progressing   Problem: Safety: Goal: Ability to remain free from injury will improve 10/03/2018 0846 by Fabienne Bruns D, RN Outcome: Progressing 10/03/2018 0846 by Fabienne Bruns D, RN Outcome: Progressing   Problem: Skin Integrity: Goal: Risk for impaired skin integrity will decrease 10/03/2018 0846 by Thressa Sheller, RN Outcome: Progressing 10/03/2018 0846 by Thressa Sheller, RN Outcome: Progressing

## 2018-10-03 NOTE — Progress Notes (Addendum)
Progress Note  Patient Name: Ariel Ramirez Date of Encounter: 10/03/2018  Primary Cardiologist: Dietrich PatesPaula Laketra Bowdish, MD   Subjective   Feeling tired this morning. No complaints of chest pain, palpitations, or SOB.   Inpatient Medications    Scheduled Meds: . [START ON 10/04/2018] aspirin EC  81 mg Oral Daily  . atorvastatin  40 mg Oral q1800  . gabapentin  300 mg Oral BID  . lamoTRIgine  100 mg Oral BID  . levothyroxine  112 mcg Oral QODAY  . levothyroxine  125 mcg Oral QODAY  . Melatonin  3 mg Oral QHS  .  morphine injection  1 mg Intravenous Once  . pantoprazole  40 mg Oral Daily  . sodium chloride flush  3 mL Intravenous Q12H  . vortioxetine HBr  10 mg Oral Daily   Continuous Infusions: . sodium chloride    . sodium chloride    . sodium chloride 100 mL/hr at 10/02/18 2214  . heparin 1,200 Units/hr (10/03/18 0834)   PRN Meds: sodium chloride, acetaminophen, LORazepam, ondansetron (ZOFRAN) IV, sodium chloride flush   Vital Signs    Vitals:   10/03/18 0000 10/03/18 0300 10/03/18 0400 10/03/18 0728  BP:  (!) 90/44 (!) 90/47 (!) 106/53  Pulse: 70 75 69   Resp: 12 12 13    Temp:  98.6 F (37 C)  98.8 F (37.1 C)  TempSrc:  Oral  Oral  SpO2: 97% 98% 95%   Weight:      Height:        Intake/Output Summary (Last 24 hours) at 10/03/2018 0834 Last data filed at 10/03/2018 0400 Gross per 24 hour  Intake 940 ml  Output -  Net 940 ml   Filed Weights   10/01/18 1423 10/02/18 0000  Weight: 77.1 kg 77.1 kg    Telemetry    NSR, rate generally in the 70s - Personally Reviewed  Physical Exam   GEN: Laying in bed in no acute distress.   Neck: No JVD, no carotid bruits Cardiac: RRR, no murmurs, rubs, or gallops.  Respiratory: Clear to auscultation bilaterally, no wheezes/ rales/ rhonchi GI: NABS, Soft, nontender, non-distended  MS: No edema; No deformity. Neuro:  Nonfocal, moving all extremities spontaneously Psych: Normal affect   Labs    Chemistry Recent Labs   Lab 10/01/18 1420 10/02/18 0241 10/03/18 0224  NA 140 137 140  K 3.5 3.2* 4.4  CL 108 104 111  CO2 23 24 24   GLUCOSE 104* 128* 116*  BUN 11 7 <5*  CREATININE 0.94 0.71 0.85  CALCIUM 8.6* 8.3* 8.2*  PROT  --  6.1*  --   ALBUMIN  --  3.6  --   AST  --  19  --   ALT  --  13  --   ALKPHOS  --  44  --   BILITOT  --  0.5  --   GFRNONAA >60 >60 >60  GFRAA >60 >60 >60  ANIONGAP 9 9 5      Hematology Recent Labs  Lab 10/01/18 1420 10/03/18 0224  WBC 5.3 5.7  RBC 4.40 3.97  HGB 12.6 11.5*  HCT 38.5 35.9*  MCV 87.5 90.4  MCH 28.6 29.0  MCHC 32.7 32.0  RDW 13.6 14.1  PLT 255 219    Cardiac EnzymesNo results for input(s): TROPONINI in the last 168 hours. No results for input(s): TROPIPOC in the last 168 hours.   BNP Recent Labs  Lab 10/02/18 0241  BNP 262.7*  DDimer No results for input(s): DDIMER in the last 168 hours.   Radiology    Dg Chest Portable 1 View  Result Date: 10/01/2018 CLINICAL DATA:  Chest pain and shortness of breath. Ex-smoker. EXAM: PORTABLE CHEST 1 VIEW COMPARISON:  12/16/2016. FINDINGS: Normal sized heart. Clear lungs with normal vascularity. Unremarkable bones. IMPRESSION: Normal examination. Electronically Signed   By: Claudie Revering M.D.   On: 10/01/2018 15:01   Ct Angio Chest/abd/pel For Dissection W And/or W/wo  Result Date: 10/01/2018 CLINICAL DATA:  58 year old female with chest pain. EXAM: CT ANGIOGRAPHY CHEST, ABDOMEN AND PELVIS TECHNIQUE: Multidetector CT imaging through the chest, abdomen and pelvis was performed using the standard protocol during bolus administration of intravenous contrast. Multiplanar reconstructed images and MIPs were obtained and reviewed to evaluate the vascular anatomy. CONTRAST:  140mL OMNIPAQUE IOHEXOL 350 MG/ML SOLN COMPARISON:  Chest radiograph dated 10/01/2018 and CT of the abdomen pelvis dated 09/29/2011 FINDINGS: CTA CHEST FINDINGS Cardiovascular: There is mild dilatation of the left cardiac chambers. No  pericardial effusion. The thoracic aorta is unremarkable. The origins of the great vessels of the aortic arch appear patent as visualized. Mild dilatation of the main pulmonary trunk which may represent a degree of pulmonary hypertension. Clinical correlation is recommended. No CT evidence of pulmonary embolism. Mediastinum/Nodes: No hilar or mediastinal adenopathy. There is a small hiatal hernia. The esophagus and the thyroid gland are grossly unremarkable. No mediastinal fluid collection. Lungs/Pleura: The lungs are clear. There is no pleural effusion or pneumothorax. The central airways are patent. Musculoskeletal: No chest wall abnormality. No acute or significant osseous findings. Review of the MIP images confirms the above findings. CTA ABDOMEN AND PELVIS FINDINGS VASCULAR Aorta: Normal caliber aorta without aneurysm, dissection, vasculitis or significant stenosis. Celiac: Patent without evidence of aneurysm, dissection, vasculitis or significant stenosis. SMA: Patent without evidence of aneurysm, dissection, vasculitis or significant stenosis. Renals: Both renal arteries are patent without evidence of aneurysm, dissection, vasculitis, fibromuscular dysplasia or significant stenosis. IMA: Patent without evidence of aneurysm, dissection, vasculitis or significant stenosis. Inflow: Patent without evidence of aneurysm, dissection, vasculitis or significant stenosis. Veins: No obvious venous abnormality within the limitations of this arterial phase study. Review of the MIP images confirms the above findings. NON-VASCULAR No intra-abdominal free air or free fluid. Hepatobiliary: The liver is unremarkable. No intrahepatic biliary ductal dilatation. Cholecystectomy. Pancreas: Unremarkable. No pancreatic ductal dilatation or surrounding inflammatory changes. Spleen: Normal in size without focal abnormality. Adrenals/Urinary Tract: Adrenal glands are unremarkable. Kidneys are normal, without renal calculi, focal  lesion, or hydronephrosis. Bladder is unremarkable. Stomach/Bowel: There is sigmoid diverticulosis without active inflammatory changes. There is moderate stool throughout the colon. There is no bowel obstruction or active inflammation. There is a 2 cm duodenal diverticulum. The appendix is normal. Lymphatic: No adenopathy. Reproductive: Hysterectomy. No pelvic mass. Other: None Musculoskeletal: Degenerative changes primarily at L5-S1. No acute osseous pathology. Review of the MIP images confirms the above findings. IMPRESSION: 1. No acute intrathoracic, abdominal, or pelvic pathology. No aortic dissection or aneurysm. No CT evidence of pulmonary embolism. 2. Colonic diverticulosis. No bowel obstruction or active inflammation. Normal appendix. Electronically Signed   By: Anner Crete M.D.   On: 10/01/2018 20:51    Cardiac Studies   Echocardiogram 10/02/2018: 1. The left ventricle has normal systolic function, with an ejection fraction of 55-60%. The cavity size was normal. Left ventricular diastolic parameters were normal.  2. Small area of posterior basal hypokinesis.  3. The right ventricle has normal systolic function. The  cavity was normal. There is no increase in right ventricular wall thickness.  4. Mild thickening of the mitral valve leaflet.  5. The aortic valve is tricuspid. Mild thickening of the aortic valve.  6. The aortic root is normal in size and structure.  Patient Profile     58 y.o. female with hypothyroidism, anxiety/depression, GERD s/p recent esophageal dilatation who presented with sudden onset of chest pain and NSTEMI. She had prior evaluation for chest pain with stress echo in 2015 negative for ischemia but with hypotensive response, and nuclear stress test in 09/2018 negative for ischemia, EF 69%. CTA for dissection unrevealing and no evidence of PE. HsTroponin peak 959.58 y.o. female with hypothyroidism, anxiety/depression, GERD s/p recent esophageal dilatation who presented  with sudden onset of chest pain and NSTEMI. She had prior evaluation for chest pain with stress echo in 2015 negative for ischemia but with hypotensive response, and nuclear stress test in 09/2018 negative for ischemia, EF 69%. CTA for dissection unrevealing and no evidence of PE. HsTroponin peak 959.  Assessment & Plan    1. NSTEMI: patient presented with chest pain. EKG with with non-specific ST-T wave abnormalities in inferior leads with new TWI in I and aVL. Trop trend 101>824>959>391. Echo 10/02/2018 with EF 55-60%, normal LV diastolic function, small area of posterior basal hypokinesis, no significant valvular abnormalities. NSTEMI suspicious for ACS 2/2 obstructive CAD vs coronary vasospasm. She was started on a heparin gtt and nitro gtt (d/c'd 2/2 hypotension). CP has resolved.  - Plan for LHC today to evaluate coronary anatomy - Continue aspirin and statin - BBlocker initially not added due to bradycardia/hypotension. HR in the 70s. Could consider adding BBlocker post cath if room in BP.   2. HLD: LDL 114 this admission. She was started on atorvastatin 40mg  daily - Continue statin at current does for now - will consider increasing dose if obstructive CAD noted on cath - Recheck FLP/LFTs in 6-8 weeks   3. Hypokalemia: resolved - K 4.4 today - Continue to monitor routinely  4. Hypothyroidism: TSH wnl this admission - Continue levothyroxine  5. Depression:  - Continue vortioxetine  6. Migraines: - Continue lamotrigine  7. GERD:  - Continue pantoprazole   For questions or updates, please contact [facility] Please consult [email or web address] for contact info under Cardiology/STEMI.      Signed, Beatriz StallionKrista M. Kroeger, PA-C  10/03/2018, 8:34 AM   [phone]  Patient seen and examined   I agree with findings as noted by Irving ShowsK Kroeger.   Currently pain free on heparin  Lungs are CTA [place] RRR   Normal S1, S2  NO S3   Abd benign Ext are without edema  NSTEMI    Troponin is falling    Plan for L heart cath today  Lipitor started   Further care based on cath results  Dietrich PatesPaula Annamarie Yamaguchi MD

## 2018-10-03 NOTE — Discharge Instructions (Signed)

## 2018-10-03 NOTE — Telephone Encounter (Signed)
-----   Message from Abigail Butts, PA-C sent at 10/03/2018  3:08 PM EDT ----- Regarding: TOC call Hey there! This patient is being discharged home today. She has an appointment to see Vin 10/11/2018. Hoping someone can place a TOC call. Thank you!  Daleen Snook

## 2018-10-03 NOTE — Interval H&P Note (Signed)
Cath Lab Visit (complete for each Cath Lab visit)  Clinical Evaluation Leading to the Procedure:   ACS: Yes.    Non-ACS:    Anginal Classification: CCS III  Anti-ischemic medical therapy: No Therapy  Non-Invasive Test Results: No non-invasive testing performed  Prior CABG: No previous CABG      History and Physical Interval Note:  10/03/2018 1:40 PM  Ariel Ramirez  has presented today for surgery, with the diagnosis of unstable angina.  The various methods of treatment have been discussed with the patient and family. After consideration of risks, benefits and other options for treatment, the patient has consented to  Procedure(s): LEFT HEART CATH AND CORONARY ANGIOGRAPHY (N/A) as a surgical intervention.  The patient's history has been reviewed, patient examined, no change in status, stable for surgery.  I have reviewed the patient's chart and labs.  Questions were answered to the patient's satisfaction.     Quay Burow

## 2018-10-03 NOTE — Progress Notes (Signed)
ANTICOAGULATION CONSULT NOTE - Follow Up Consult  Pharmacy Consult for heparin Indication: chest pain/ACS  No Known Allergies  Patient Measurements: Height: 5\' 6"  (167.6 cm) Weight: 169 lb 15.6 oz (77.1 kg) IBW/kg (Calculated) : 59.3 Heparin Dosing Weight: 75 kg  Vital Signs: Temp: 98.8 F (37.1 C) (07/20 0728) Temp Source: Oral (07/20 0728) BP: 106/53 (07/20 0728) Pulse Rate: 69 (07/20 0400)  Labs: Recent Labs    10/01/18 1420 10/01/18 1630 10/01/18 2054  10/02/18 0241 10/02/18 0525 10/02/18 1208 10/03/18 0224  HGB 12.6  --   --   --   --   --   --  11.5*  HCT 38.5  --   --   --   --   --   --  35.9*  PLT 255  --   --   --   --   --   --  219  HEPARINUNFRC  --   --   --    < > 0.23* 0.78* 0.36 0.28*  CREATININE 0.94  --   --   --  0.71  --   --  0.85  TROPONINIHS 101* 824* 959*  --  391*  --   --   --    < > = values in this interval not displayed.    Estimated Creatinine Clearance: 77.5 mL/min (by C-G formula based on SCr of 0.85 mg/dL).   Medical History: Past Medical History:  Diagnosis Date  . Anxiety   . Arthritis   . Chest pain, atypical 2015   Echo and stress echo were okay  . Depression   . Hypothyroid     Assessment: 59 yof presenting with sudden onset CP with SOB. No AC PTA.   Heparin drip just below goal on 1100 units/hr. No bleeding noted. Plan for cath today.   Goal of Therapy:  Heparin level 0.3-0.7 units/ml Monitor platelets by anticoagulation protocol: Yes   Plan:  Increase Heparin drip to 1200 uts/hr  Daily HL, CBC  Plan cath today  Erin Hearing PharmD., BCPS Clinical Pharmacist 10/03/2018 8:30 AM

## 2018-10-04 ENCOUNTER — Encounter (HOSPITAL_COMMUNITY): Payer: Self-pay | Admitting: Cardiovascular Disease

## 2018-10-04 NOTE — Telephone Encounter (Signed)
**Note De-Identified Rashan Patient Obfuscation** TCM Call: 1st attempt I left a message on both the pts home and cell phone VMs asking her to return my call.

## 2018-10-05 NOTE — Telephone Encounter (Signed)
Follow up  ° ° °Patient is returning call.  °

## 2018-10-05 NOTE — Telephone Encounter (Signed)
TCM Call: 2nd attempt LMTCB. I did leave reminder of her hosp f/u with Vin on 7/28 @ 10:45 in the VM.

## 2018-10-06 ENCOUNTER — Telehealth: Payer: Self-pay

## 2018-10-06 NOTE — Telephone Encounter (Signed)
Pt submitted a Pt Asst application to [employer] for Eliquis. I printed out the provider portion and had Dr Angelena Form sign (he was DOD) and it was faxed to [place].

## 2018-10-06 NOTE — Telephone Encounter (Signed)
**Note De-Identified Ariel Ramirez Obfuscation** TCM Call-3rd attempt LMTCM. The pt did identify herself on her VM message so I LMTCB. I did state in the VM message that this call is concerning her hospital discharge on 7/20 and that Im calling to make sure she is doing ok and to answer any questions or concerns that she may have. I did leave reminder again of the pts up coming hospital f/u with Vin on 7/28 at 10:45 just in case we do not get to discuss her hospital discharge and f/u.

## 2018-10-10 ENCOUNTER — Telehealth: Payer: Self-pay | Admitting: Physician Assistant

## 2018-10-10 NOTE — Telephone Encounter (Signed)
New Message ° ° ° °Left message to confirm appt and answer covid questions  °

## 2018-10-11 ENCOUNTER — Encounter: Payer: Self-pay | Admitting: Physician Assistant

## 2018-10-11 ENCOUNTER — Other Ambulatory Visit: Payer: Self-pay

## 2018-10-11 ENCOUNTER — Ambulatory Visit (INDEPENDENT_AMBULATORY_CARE_PROVIDER_SITE_OTHER): Payer: Federal, State, Local not specified - PPO | Admitting: Physician Assistant

## 2018-10-11 VITALS — BP 130/70 | HR 77 | Ht 66.0 in | Wt 167.8 lb

## 2018-10-11 DIAGNOSIS — R0789 Other chest pain: Secondary | ICD-10-CM

## 2018-10-11 DIAGNOSIS — Z7189 Other specified counseling: Secondary | ICD-10-CM

## 2018-10-11 DIAGNOSIS — R0609 Other forms of dyspnea: Secondary | ICD-10-CM

## 2018-10-11 DIAGNOSIS — I214 Non-ST elevation (NSTEMI) myocardial infarction: Secondary | ICD-10-CM | POA: Diagnosis not present

## 2018-10-11 MED ORDER — DILTIAZEM HCL 60 MG PO TABS: 60.0000 mg | ORAL_TABLET | Freq: Two times a day (BID) | ORAL | 3 refills | Status: DC

## 2018-10-11 MED ORDER — NITROGLYCERIN 0.4 MG SL SUBL: 0.4000 mg | SUBLINGUAL_TABLET | SUBLINGUAL | 3 refills | Status: DC | PRN

## 2018-10-11 NOTE — Progress Notes (Signed)
Cardiology Office Note    Date:  10/11/2018   ID:  Ariel Ramirez, DOB 01-26-1961, MRN [id number]  PCP:  Joette CatchingNyland, Leonard, MD  Cardiologist:  Dr. Tenny Crawoss  Chief Complaint: Hospital follow up  History of Present Illness:   Ariel Ramirez is a 58 y.o. female hypothyroidism, anxiety/depression, GERD s/p recent esophageal dilatation and chest pain seen for hospital follow up.   Chest pain 2015 s/p echo & stress echo>>ok. Chest pain 2018 >>no further eval indicated.   She had problems swallowing, 05/2018 EGD w/ dilatation>>ever since then, probs w/ epigastric and abd pain and bloating. Seen by Leanora Ivanoffhonda Battett 09/08/2018 for chest pain and dyspnea on exertion (started after she was treated for esophageal stricture, with dilatation)>> Low risk Lexiscan Myoview 09/28/2018.  She presented to ER 10/01/2018 sudden onset chest pain radiating to back and shoulder. Symptoms improved with SL nitro. hsTn of 101 -> 824 -> 959>391. Initial EKG at triage with sub mm STE in AVL and I, which resolved on subsequent EKGs. Nitro gtt was started and uptitrated to 50 mcg with improvement in her pain to 3/10. CTA of chest without PE. Echo 10/02/2018 with EF 55-60%, normal LV diastolic function, small area of posterior basal hypokinesis, no significant valvular abnormalities. LHC 10/03/2018 which revealed normal coronary anatomy. She was started on diltiazem 30mg  BID for possible vasospasm. She was started on atorvastatin for risk factor modifications.   Here today for follow-up.  Patient reports improved intensity and severity of chest pain since on Cardizem.  Continues to have intermittent shortness of breath.  Patient has lots of underlying anxiety and stress.  She is taking care of demented elderly mother.  She was prescribed Klonopin and Ativan as needed for anxiety but never took it.  She denies orthopnea, PND, syncope, dizziness, lower extremity edema or melena.      Past Medical History:  Diagnosis Date  .  Anxiety   . Arthritis   . Chest pain, atypical 2015   Echo and stress echo were okay  . Depression   . Hypothyroid     Past Surgical History:  Procedure Laterality Date  . CHOLECYSTECTOMY  11/13/2011   Procedure: LAPAROSCOPIC CHOLECYSTECTOMY;  Surgeon: Dalia HeadingMark A Jenkins, MD;  Location: [facility];  Service: General;  Laterality: N/A;  . ESOPHAGOGASTRODUODENOSCOPY  11/09/2011   Procedure: ESOPHAGOGASTRODUODENOSCOPY (EGD);  Surgeon: Malissa HippoNajeeb U Rehman, MD;  Location: [facility];  Service: Endoscopy;  Laterality: N/A;  730  . LEFT HEART CATH AND CORONARY ANGIOGRAPHY N/A 10/03/2018   Procedure: LEFT HEART CATH AND CORONARY ANGIOGRAPHY;  Surgeon: Runell GessBerry, Jonathan J, MD;  Location: [facility];  Service: Cardiovascular;  Laterality: N/A;  . PARTIAL HYSTERECTOMY     2011  . TUBAL LIGATION     20 yrs ago    Current Medications: Prior to Admission medications   Medication Sig Start Date End Date Taking? Authorizing Provider  atorvastatin (LIPITOR) 20 MG tablet Take 1 tablet (20 mg total) by mouth daily at 6 PM. 10/03/18   Kroeger, Ovidio KinKrista M., PA-C  clonazePAM (KLONOPIN) 0.5 MG tablet Take 1 tablet by mouth 2 (two) times daily as needed. Reported on 09/19/2015 03/02/13   [provider]  diltiazem (CARDIZEM) 30 MG tablet Take 1 tablet (30 mg total) by mouth every 12 (twelve) hours. 10/03/18   Kroeger, Ovidio KinKrista M., PA-C  estradiol (ESTRACE) 0.5 MG tablet Take 0.5 mg by mouth daily.    [provider]  gabapentin (NEURONTIN) 300 MG capsule Take 300  mg by mouth 2 (two) times daily. Anxiety Attacks     [provider]  hydrOXYzine (ATARAX/VISTARIL) 10 MG tablet Take 10 mg by mouth daily as needed for itching or anxiety.     [provider]  lamoTRIgine (LAMICTAL) 100 MG tablet Take 100 mg by mouth 2 (two) times daily.     [provider]  levothyroxine (SYNTHROID, LEVOTHROID) 112 MCG tablet Take 112 mcg by mouth every other day. Alternate with 125 mg    [provider]  levothyroxine (SYNTHROID, LEVOTHROID) 125 MCG tablet Take 125 mcg by mouth every other day. Alternate with 112 mcg    [provider]  LORazepam (ATIVAN) 1 MG tablet Take 1 mg by mouth daily as needed for anxiety.    [provider]  Melatonin 10 MG TABS Take 10 mg by mouth at bedtime.    [provider]  pantoprazole (PROTONIX) 40 MG tablet Take 40 mg by mouth daily.    [provider]  vortioxetine HBr (TRINTELLIX) 10 MG TABS tablet Take 10 mg by mouth daily.    [provider]    Allergies:   Patient has no known allergies.   Social History   Socioeconomic History  . Marital status: Married    Spouse name: Not on file  . Number of children: Not on file  . Years of education: Not on file  . Highest education level: Not on file  Occupational History  . Not on file  Social Needs  . Financial resource strain: Not hard at all  . Food insecurity    Worry: Never true    Inability: Never true  . Transportation needs    Medical: No    Non-medical: No  Tobacco Use  . Smoking status: Former Smoker    Types: Cigarettes    Quit date: 10/26/2007    Years since quitting: 10.9  . Smokeless tobacco: Never Used  Substance and Sexual Activity  . Alcohol use: Yes    Alcohol/week: 3.0 standard drinks    Types: 3 Glasses of wine per week    Comment: on occassion  . Drug use: No  . Sexual activity: Yes    Birth control/protection: None  Lifestyle  . Physical activity    Days per week: 3 days    Minutes per session: 30 min  . Stress: Only a little  Relationships  . Social connections    Talks on phone: More than three times a week    Gets together: More than three times a week    Attends religious service: 1 to 4 times per year    Active member of club or organization: No    Attends meetings of clubs or organizations: Never    Relationship status: Married  Other Topics Concern  . Not on file  Social History Narrative  .  Not on file     Family History:  The patient's family history includes ADD / ADHD in her son; Alcohol abuse in her father; Anxiety disorder in her sister; Arthritis in her mother; Bipolar disorder in her son; Depression in her brother, paternal aunt, and sister; Diabetes in her father; Healthy in her daughter, son, and son; Hypertension in her father; Stroke in her father.   ROS:   Please see the history of present illness.    ROS All other systems reviewed and are negative.   PHYSICAL EXAM:   VS:  BP 130/70   Pulse 77   Ht 5\' 6"  (  1.676 m)   Wt 167 lb 12.8 oz (76.1 kg)   SpO2 98%   BMI 27.08 kg/m    GEN: Well nourished, well developed, in no acute distress  HEENT: normal  Neck: no JVD, carotid bruits, or masses Cardiac: RRR; no murmurs, rubs, or gallops,no edema  Respiratory:  clear to auscultation bilaterally, normal work of breathing GI: soft, nontender, nondistended, + BS MS: no deformity or atrophy  Skin: warm and dry, no rash Neuro:  Alert and Oriented x 3, Strength and sensation are intact Psych: euthymic mood, full affect  Wt Readings from Last 3 Encounters:  10/11/18 167 lb 12.8 oz (76.1 kg)  10/02/18 169 lb 15.6 oz (77.1 kg)  09/08/18 169 lb (76.7 kg)      Studies/Labs Reviewed:   EKG:  EKG is not ordered today.    Recent Labs: 10/02/2018: ALT 13; B Natriuretic Peptide 262.7; TSH 1.772 10/03/2018: BUN <5; Creatinine, Ser 0.85; Hemoglobin 11.5; Magnesium 2.1; Platelets 219; Potassium 4.4; Sodium 140   Lipid Panel    Component Value Date/Time   CHOL 178 10/02/2018 0241   TRIG 65 10/02/2018 0241   HDL 51 10/02/2018 0241   CHOLHDL 3.5 10/02/2018 0241   VLDL 13 10/02/2018 0241   LDLCALC 114 (H) 10/02/2018 0241    Additional studies/ records that were reviewed today include:   Left heart catheterization 10/03/2018: IMPRESSION:Ms. Alvi had normal coronary arteries and normal filling pressures. I believe her chest pain was noncardiac potentially related  to coronary vasospasm. The sheath was removed and a TR band was placed on the right wrist to achieve patent hemostasis. The patient left lab in stable condition. She can be discharged home later today with outpatient follow-up.  Echocardiogram 10/02/2018: 1. The left ventricle has normal systolic function, with an ejection fraction of 55-60%. The cavity size was normal. Left ventricular diastolic parameters were normal. 2. Small area of posterior basal hypokinesis. 3. The right ventricle has normal systolic function. The cavity was normal. There is no increase in right ventricular wall thickness. 4. Mild thickening of the mitral valve leaflet. 5. The aortic valve is tricuspid. Mild thickening of the aortic valve. 6. The aortic root is normal in size and structure.   ASSESSMENT & PLAN:    1. Chest pain with recent non-STEMI High-sensitivity troponin peaked at 959.  CT of the chest negative for PE.  Her symptoms improved on nitroglycerin gtt during admission.  Cardiac catheterization revealed normal coronaries.  Echocardiogram showed preserved LV function at 55 to 60%, small area of posterior beta-blocker.  She was started on diltiazem for possible vasospasm -Patient reports improved intensity and severity of chest pain since discharge. -Differential includes vasospasm, stress related vs GI (started after she was treated for esophageal stricture, with dilatation).  - will increase cardizem to 60mg  BId as she has noted improvement.  Advised to avoid caffeine intake. PRN nitro.   2. Anxiety/stress - She will call her counselor for appointment  3. DOE - no definite etiology as above. Likely due to deconditioning. Advise to start exercise  Spent > 20 minutes discussing recent labs and test. Reviewed differential in detailed.   Medication Adjustments/Labs and Tests Ordered: Current medicines are reviewed at length with the patient today.  Concerns regarding medicines are outlined above.   Medication changes, Labs and Tests ordered today are listed in the Patient Instructions below. Patient Instructions  Medication Instructions:  Your physician has recommended you make the following change in your medication:  1.  INCREASE  the Diltiazem to 60 mg daily.  I have sent in a new prescription for you. 2.  START Nitroglycerin 0.4 s/l tablets.  ONLY AS NEEDED.  TAKE AS DIRECTED ON THE BOTTLE.    If you need a refill on your cardiac medications before your next appointment, please call your pharmacy.   Lab work: None ordered If you have labs (blood work) drawn today and your tests are completely normal, you will receive your results only by: Marland Kitchen. MyChart Message (if you have MyChart) OR . A paper copy in the mail If you have any lab test that is abnormal or we need to change your treatment, we will call you to review the results.  Testing/Procedures: None ordered  Follow-Up: At [facility], you and your health needs are our priority.  As part of our continuing mission to provide you with exceptional heart care, we have created designated Provider Care Teams.  These Care Teams include your primary Cardiologist (physician) and Advanced Practice Providers (APPs -  Physician Assistants and Nurse Practitioners) who all work together to provide you with the care you need, when you need it. You will need a follow up appointment in:  3 months WITH DR. Tenny CrawOSS. 10/300/2020 A 9:40  Any Other Special Instructions Will Be Listed Below (If Applicable).       Lorelei PontSigned, Mychele Seyller, [place]  10/11/2018 11:29 AM    [facility] [address], [place], [place]  [address] Phone: [phone]) [phone]; Fax: [phone]

## 2018-10-11 NOTE — Patient Instructions (Signed)
Medication Instructions:  Your physician has recommended you make the following change in your medication:  1.  INCREASE the Diltiazem to 60 mg daily.  I have sent in a new prescription for you. 2.  START Nitroglycerin 0.4 s/l tablets.  ONLY AS NEEDED.  TAKE AS DIRECTED ON THE BOTTLE.    If you need a refill on your cardiac medications before your next appointment, please call your pharmacy.   Lab work: None ordered If you have labs (blood work) drawn today and your tests are completely normal, you will receive your results only by: Marland Kitchen MyChart Message (if you have MyChart) OR . A paper copy in the mail If you have any lab test that is abnormal or we need to change your treatment, we will call you to review the results.  Testing/Procedures: None ordered  Follow-Up: At [facility], you and your health needs are our priority.  As part of our continuing mission to provide you with exceptional heart care, we have created designated Provider Care Teams.  These Care Teams include your primary Cardiologist (physician) and Advanced Practice Providers (APPs -  Physician Assistants and Nurse Practitioners) who all work together to provide you with the care you need, when you need it. You will need a follow up appointment in:  3 months WITH DR. Harrington Challenger. 10/300/2020 A 9:40  Any Other Special Instructions Will Be Listed Below (If Applicable).

## 2018-10-12 ENCOUNTER — Telehealth: Payer: Self-pay | Admitting: *Deleted

## 2018-10-12 NOTE — Telephone Encounter (Signed)
Patient called requesting a phone call to explain her recent hospitalization. Says she didn't understand anything from visit yesterday. Says she was told her she had the heart of a new born baby but was told while in the hospital that she had a heart attack. Patient would like to speak with someone about her hospitalization and says she didn't want to wait until planned visit in October 2020.

## 2018-10-14 ENCOUNTER — Telehealth: Payer: Self-pay

## 2018-10-14 NOTE — Telephone Encounter (Signed)
I have reviewed pt's chart and I am did not see where Eliquis was ordered for the pt. I do not see Eliquis on med list.

## 2018-10-14 NOTE — Telephone Encounter (Signed)
We received a fax from [facility] stating that they have not received the pts part of the pt asst application yet and requested that we add the pts DOB and to to put a check that Eliquis should be mailed to the pts address.  We have taken care of the missing info on the provider part of the application and faxed it back to [employer].  I have left a detailed message on the pts cell and home phone VMs asking her to mail in her part of her [employer] pt asst application and that if she has and has questions concerning this to contact [employer] at [phone]. I also left the office phone number so she can call us if she has any questions concerning this.

## 2018-10-14 NOTE — Telephone Encounter (Signed)
New Message    Pt c/o medication issue:  1. Name of Medication: Eliquis  2. How are you currently taking this medication (dosage and times per day)? N/A  3. Are you having a reaction (difficulty breathing--STAT)? NO  4. What is your medication issue? Patient states she was told to call Valent[facility] Hue for help with medication and patient wasn't aware of being put on medication and it's not in her chart.

## 2018-10-17 NOTE — Telephone Encounter (Signed)
I left a detailed message on the pts VM apologizing for the misunderstanding and advising that Eliquis is not and has not been on her medication list and that the message to contact [employer] was left in error. I did leave the office phone number so she can call us back if she has any questions or concerns.

## 2018-10-18 DIAGNOSIS — D649 Anemia, unspecified: Secondary | ICD-10-CM | POA: Diagnosis not present

## 2018-10-18 DIAGNOSIS — K219 Gastro-esophageal reflux disease without esophagitis: Secondary | ICD-10-CM | POA: Diagnosis not present

## 2018-10-18 DIAGNOSIS — R0789 Other chest pain: Secondary | ICD-10-CM | POA: Diagnosis not present

## 2018-10-18 DIAGNOSIS — R7309 Other abnormal glucose: Secondary | ICD-10-CM | POA: Diagnosis not present

## 2018-10-18 DIAGNOSIS — R05 Cough: Secondary | ICD-10-CM | POA: Diagnosis not present

## 2018-10-18 DIAGNOSIS — E785 Hyperlipidemia, unspecified: Secondary | ICD-10-CM | POA: Diagnosis not present

## 2018-10-20 NOTE — Telephone Encounter (Signed)
Unable to reach patient a few times with phone calls   Left msg  Pt recent admit for CP   Echo normal  Trivial elevation of troponin (High sensitivity trop is much more sensitive than previous troponin)   Cath was normal   Felt possible CP due to spasm of arteries   Now on diltiazem Ariel Ramirez in clinic not long ago   Feeling some better   I would continue   I am happy about response   Again, coronary arteries look good   If has some spasm, prognosis overaill is very good  Pt has appt in October with me

## 2018-10-21 DIAGNOSIS — R0789 Other chest pain: Secondary | ICD-10-CM | POA: Diagnosis not present

## 2018-10-21 DIAGNOSIS — K5900 Constipation, unspecified: Secondary | ICD-10-CM | POA: Diagnosis not present

## 2018-10-21 DIAGNOSIS — K219 Gastro-esophageal reflux disease without esophagitis: Secondary | ICD-10-CM | POA: Diagnosis not present

## 2018-11-08 DIAGNOSIS — I214 Non-ST elevation (NSTEMI) myocardial infarction: Secondary | ICD-10-CM | POA: Diagnosis not present

## 2018-11-08 DIAGNOSIS — R0789 Other chest pain: Secondary | ICD-10-CM | POA: Diagnosis not present

## 2018-11-08 DIAGNOSIS — E785 Hyperlipidemia, unspecified: Secondary | ICD-10-CM | POA: Diagnosis not present

## 2018-11-14 DIAGNOSIS — F332 Major depressive disorder, recurrent severe without psychotic features: Secondary | ICD-10-CM | POA: Diagnosis not present

## 2018-11-14 DIAGNOSIS — F411 Generalized anxiety disorder: Secondary | ICD-10-CM | POA: Diagnosis not present

## 2018-11-14 DIAGNOSIS — F4310 Post-traumatic stress disorder, unspecified: Secondary | ICD-10-CM | POA: Diagnosis not present

## 2018-11-14 DIAGNOSIS — F303 Manic episode in partial remission: Secondary | ICD-10-CM | POA: Diagnosis not present

## 2019-01-12 DIAGNOSIS — R079 Chest pain, unspecified: Secondary | ICD-10-CM | POA: Diagnosis not present

## 2019-01-12 DIAGNOSIS — Z79899 Other long term (current) drug therapy: Secondary | ICD-10-CM | POA: Diagnosis not present

## 2019-01-12 DIAGNOSIS — F329 Major depressive disorder, single episode, unspecified: Secondary | ICD-10-CM | POA: Diagnosis not present

## 2019-01-12 DIAGNOSIS — I252 Old myocardial infarction: Secondary | ICD-10-CM | POA: Diagnosis not present

## 2019-01-12 DIAGNOSIS — E079 Disorder of thyroid, unspecified: Secondary | ICD-10-CM | POA: Diagnosis not present

## 2019-01-12 DIAGNOSIS — R0602 Shortness of breath: Secondary | ICD-10-CM | POA: Diagnosis not present

## 2019-01-12 DIAGNOSIS — R0789 Other chest pain: Secondary | ICD-10-CM | POA: Diagnosis not present

## 2019-01-13 ENCOUNTER — Ambulatory Visit: Payer: Federal, State, Local not specified - PPO | Admitting: Internal Medicine

## 2019-02-10 DIAGNOSIS — R0789 Other chest pain: Secondary | ICD-10-CM | POA: Diagnosis not present

## 2019-02-10 DIAGNOSIS — E785 Hyperlipidemia, unspecified: Secondary | ICD-10-CM | POA: Diagnosis not present

## 2019-02-10 DIAGNOSIS — I214 Non-ST elevation (NSTEMI) myocardial infarction: Secondary | ICD-10-CM | POA: Diagnosis not present

## 2019-02-24 DIAGNOSIS — J0100 Acute maxillary sinusitis, unspecified: Secondary | ICD-10-CM | POA: Diagnosis not present

## 2019-05-02 DIAGNOSIS — E559 Vitamin D deficiency, unspecified: Secondary | ICD-10-CM | POA: Diagnosis not present

## 2019-05-02 DIAGNOSIS — R1310 Dysphagia, unspecified: Secondary | ICD-10-CM | POA: Diagnosis not present

## 2019-05-02 DIAGNOSIS — R5383 Other fatigue: Secondary | ICD-10-CM | POA: Diagnosis not present

## 2019-05-02 DIAGNOSIS — E039 Hypothyroidism, unspecified: Secondary | ICD-10-CM | POA: Diagnosis not present

## 2019-05-02 DIAGNOSIS — E785 Hyperlipidemia, unspecified: Secondary | ICD-10-CM | POA: Diagnosis not present

## 2019-05-05 DIAGNOSIS — R1310 Dysphagia, unspecified: Secondary | ICD-10-CM | POA: Diagnosis not present

## 2019-05-05 DIAGNOSIS — E039 Hypothyroidism, unspecified: Secondary | ICD-10-CM | POA: Diagnosis not present

## 2019-05-05 DIAGNOSIS — E042 Nontoxic multinodular goiter: Secondary | ICD-10-CM | POA: Diagnosis not present

## 2019-05-23 DIAGNOSIS — E039 Hypothyroidism, unspecified: Secondary | ICD-10-CM | POA: Diagnosis not present

## 2019-05-23 DIAGNOSIS — F419 Anxiety disorder, unspecified: Secondary | ICD-10-CM | POA: Diagnosis not present

## 2019-05-23 DIAGNOSIS — F3289 Other specified depressive episodes: Secondary | ICD-10-CM | POA: Diagnosis not present

## 2019-05-23 DIAGNOSIS — F3162 Bipolar disorder, current episode mixed, moderate: Secondary | ICD-10-CM | POA: Diagnosis not present

## 2019-07-12 DIAGNOSIS — I214 Non-ST elevation (NSTEMI) myocardial infarction: Secondary | ICD-10-CM | POA: Diagnosis not present

## 2019-07-12 DIAGNOSIS — R0789 Other chest pain: Secondary | ICD-10-CM | POA: Diagnosis not present

## 2019-07-12 DIAGNOSIS — E039 Hypothyroidism, unspecified: Secondary | ICD-10-CM | POA: Diagnosis not present

## 2019-07-12 DIAGNOSIS — F3162 Bipolar disorder, current episode mixed, moderate: Secondary | ICD-10-CM | POA: Diagnosis not present

## 2019-07-12 DIAGNOSIS — F419 Anxiety disorder, unspecified: Secondary | ICD-10-CM | POA: Diagnosis not present

## 2019-07-17 DIAGNOSIS — N760 Acute vaginitis: Secondary | ICD-10-CM | POA: Diagnosis not present

## 2019-07-17 DIAGNOSIS — N959 Unspecified menopausal and perimenopausal disorder: Secondary | ICD-10-CM | POA: Diagnosis not present

## 2019-07-17 DIAGNOSIS — R102 Pelvic and perineal pain: Secondary | ICD-10-CM | POA: Diagnosis not present

## 2019-07-17 DIAGNOSIS — Z79890 Hormone replacement therapy (postmenopausal): Secondary | ICD-10-CM | POA: Diagnosis not present

## 2019-08-09 DIAGNOSIS — R0789 Other chest pain: Secondary | ICD-10-CM | POA: Diagnosis not present

## 2019-08-09 DIAGNOSIS — E785 Hyperlipidemia, unspecified: Secondary | ICD-10-CM | POA: Diagnosis not present

## 2019-08-09 DIAGNOSIS — I214 Non-ST elevation (NSTEMI) myocardial infarction: Secondary | ICD-10-CM | POA: Diagnosis not present

## 2019-08-09 DIAGNOSIS — R079 Chest pain, unspecified: Secondary | ICD-10-CM | POA: Diagnosis not present

## 2019-08-30 DIAGNOSIS — I340 Nonrheumatic mitral (valve) insufficiency: Secondary | ICD-10-CM | POA: Diagnosis not present

## 2019-09-01 ENCOUNTER — Other Ambulatory Visit: Payer: Self-pay | Admitting: Medical

## 2019-09-11 DIAGNOSIS — K080 Exfoliation of teeth due to systemic causes: Secondary | ICD-10-CM | POA: Diagnosis not present

## 2019-10-16 DIAGNOSIS — E7800 Pure hypercholesterolemia, unspecified: Secondary | ICD-10-CM | POA: Diagnosis not present

## 2019-10-16 DIAGNOSIS — N390 Urinary tract infection, site not specified: Secondary | ICD-10-CM | POA: Diagnosis not present

## 2019-10-16 DIAGNOSIS — E039 Hypothyroidism, unspecified: Secondary | ICD-10-CM | POA: Diagnosis not present

## 2019-11-09 DIAGNOSIS — Z1231 Encounter for screening mammogram for malignant neoplasm of breast: Secondary | ICD-10-CM | POA: Diagnosis not present

## 2019-11-09 DIAGNOSIS — Z01419 Encounter for gynecological examination (general) (routine) without abnormal findings: Secondary | ICD-10-CM | POA: Diagnosis not present

## 2019-11-09 DIAGNOSIS — Z6826 Body mass index (BMI) 26.0-26.9, adult: Secondary | ICD-10-CM | POA: Diagnosis not present

## 2019-11-13 DIAGNOSIS — H40033 Anatomical narrow angle, bilateral: Secondary | ICD-10-CM | POA: Diagnosis not present

## 2019-11-13 DIAGNOSIS — H2513 Age-related nuclear cataract, bilateral: Secondary | ICD-10-CM | POA: Diagnosis not present

## 2019-11-24 DIAGNOSIS — Z6826 Body mass index (BMI) 26.0-26.9, adult: Secondary | ICD-10-CM | POA: Diagnosis not present

## 2019-11-24 DIAGNOSIS — R519 Headache, unspecified: Secondary | ICD-10-CM | POA: Diagnosis not present

## 2019-11-24 DIAGNOSIS — R5383 Other fatigue: Secondary | ICD-10-CM | POA: Diagnosis not present

## 2019-12-07 DIAGNOSIS — H40033 Anatomical narrow angle, bilateral: Secondary | ICD-10-CM | POA: Diagnosis not present

## 2019-12-28 DIAGNOSIS — H40033 Anatomical narrow angle, bilateral: Secondary | ICD-10-CM | POA: Diagnosis not present

## 2020-01-08 ENCOUNTER — Other Ambulatory Visit: Payer: Self-pay | Admitting: Internal Medicine

## 2020-01-17 ENCOUNTER — Other Ambulatory Visit: Payer: Self-pay | Admitting: Internal Medicine

## 2020-02-01 DIAGNOSIS — E039 Hypothyroidism, unspecified: Secondary | ICD-10-CM | POA: Diagnosis not present

## 2020-02-01 DIAGNOSIS — F419 Anxiety disorder, unspecified: Secondary | ICD-10-CM | POA: Diagnosis not present

## 2020-02-01 DIAGNOSIS — G43019 Migraine without aura, intractable, without status migrainosus: Secondary | ICD-10-CM | POA: Diagnosis not present

## 2020-02-01 DIAGNOSIS — E785 Hyperlipidemia, unspecified: Secondary | ICD-10-CM | POA: Diagnosis not present

## 2020-02-02 DIAGNOSIS — E039 Hypothyroidism, unspecified: Secondary | ICD-10-CM | POA: Diagnosis not present

## 2020-02-02 DIAGNOSIS — E785 Hyperlipidemia, unspecified: Secondary | ICD-10-CM | POA: Diagnosis not present

## 2020-02-02 DIAGNOSIS — F419 Anxiety disorder, unspecified: Secondary | ICD-10-CM | POA: Diagnosis not present

## 2020-02-05 DIAGNOSIS — H2513 Age-related nuclear cataract, bilateral: Secondary | ICD-10-CM | POA: Diagnosis not present

## 2020-02-05 DIAGNOSIS — H40033 Anatomical narrow angle, bilateral: Secondary | ICD-10-CM | POA: Diagnosis not present

## 2020-03-06 DIAGNOSIS — F3162 Bipolar disorder, current episode mixed, moderate: Secondary | ICD-10-CM | POA: Diagnosis not present

## 2020-03-06 DIAGNOSIS — I252 Old myocardial infarction: Secondary | ICD-10-CM | POA: Diagnosis not present

## 2020-03-06 DIAGNOSIS — E039 Hypothyroidism, unspecified: Secondary | ICD-10-CM | POA: Diagnosis not present

## 2020-03-06 DIAGNOSIS — R42 Dizziness and giddiness: Secondary | ICD-10-CM | POA: Diagnosis not present

## 2020-03-12 DIAGNOSIS — Z87891 Personal history of nicotine dependence: Secondary | ICD-10-CM | POA: Diagnosis not present

## 2020-03-12 DIAGNOSIS — R079 Chest pain, unspecified: Secondary | ICD-10-CM | POA: Diagnosis not present

## 2020-03-12 DIAGNOSIS — R110 Nausea: Secondary | ICD-10-CM | POA: Diagnosis not present

## 2020-03-12 DIAGNOSIS — Z79899 Other long term (current) drug therapy: Secondary | ICD-10-CM | POA: Diagnosis not present

## 2020-03-12 DIAGNOSIS — R42 Dizziness and giddiness: Secondary | ICD-10-CM | POA: Diagnosis not present

## 2020-03-12 DIAGNOSIS — R0789 Other chest pain: Secondary | ICD-10-CM | POA: Diagnosis not present

## 2020-03-12 DIAGNOSIS — Z7982 Long term (current) use of aspirin: Secondary | ICD-10-CM | POA: Diagnosis not present

## 2020-03-14 DIAGNOSIS — R0789 Other chest pain: Secondary | ICD-10-CM | POA: Diagnosis not present

## 2020-06-06 ENCOUNTER — Ambulatory Visit: Payer: Federal, State, Local not specified - PPO | Admitting: Internal Medicine

## 2020-12-10 IMAGING — DX PORTABLE CHEST - 1 VIEW
1 series · 1 of 1 positions shown · non-contrast
Comparison: 12/16/2016.

CLINICAL DATA: Chest pain and shortness of breath. Ex-smoker.

EXAM:
PORTABLE CHEST 1 VIEW

[chest]
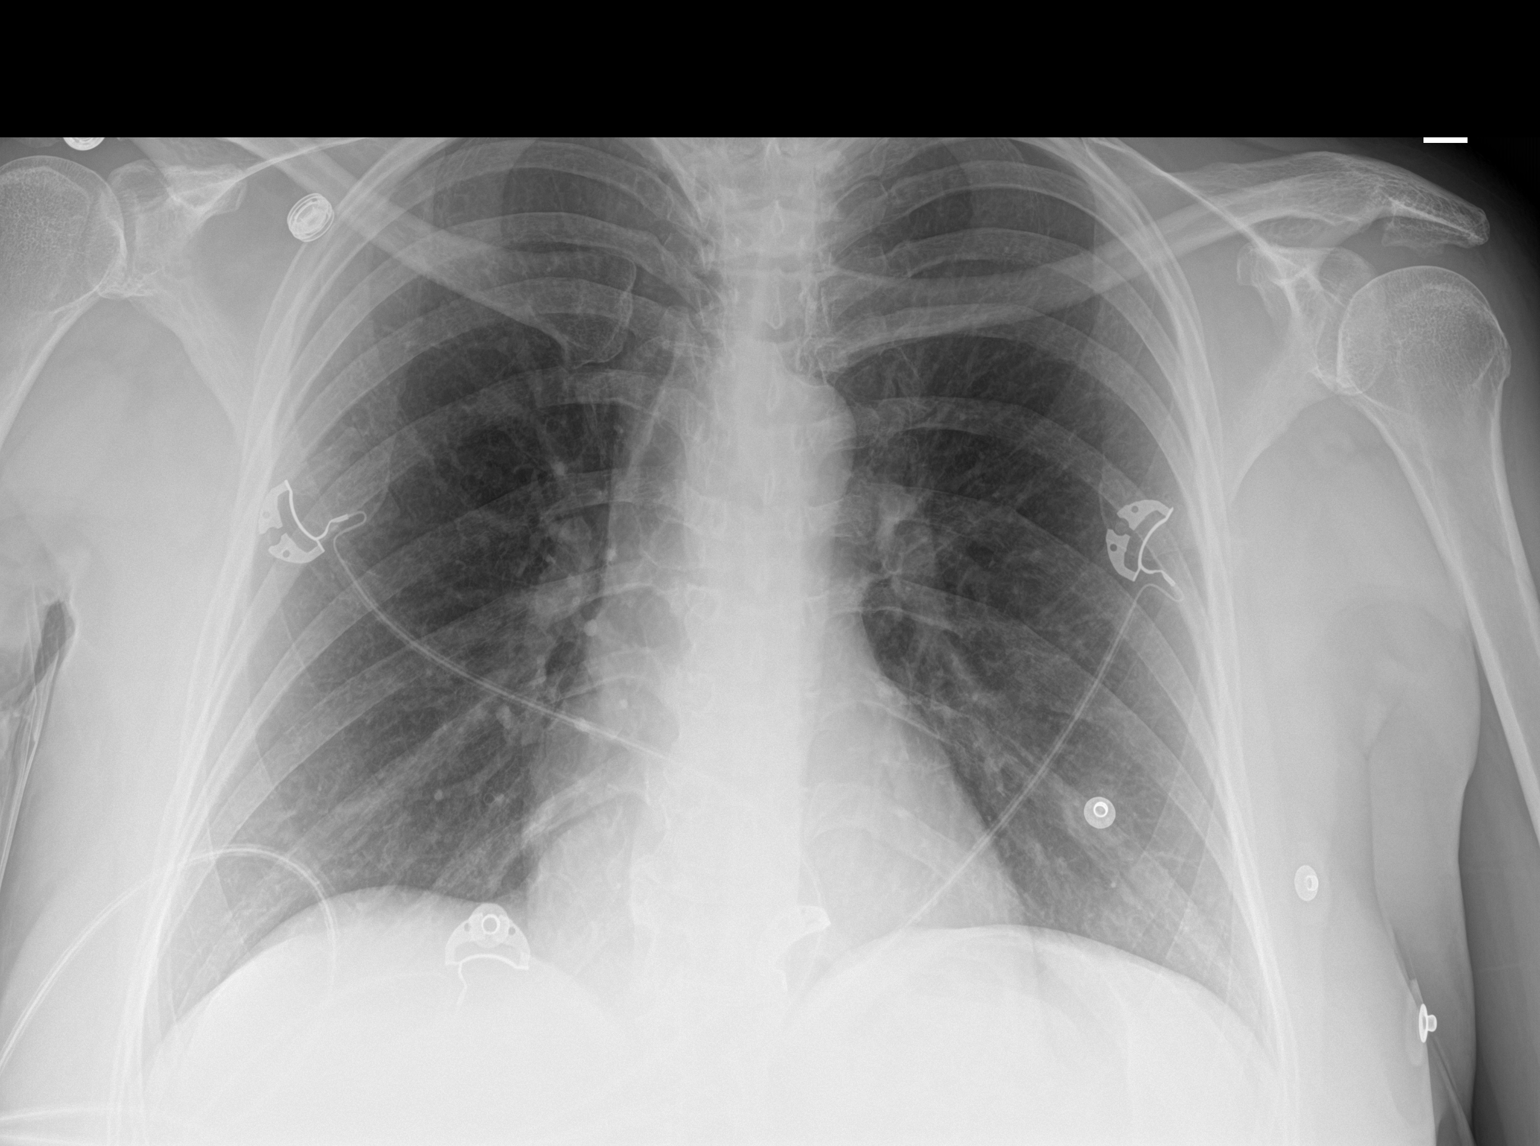

[1 of 1 positions shown; findings below may reference images not displayed]

FINDINGS: Normal sized heart. Clear lungs with normal vascularity.
Unremarkable bones.
IMPRESSION: Normal examination.

## 2020-12-10 IMAGING — CT CT ANGIO CHEST-ABD-PELV FOR DISSECTION W/ AND WO/W CM
2 of 8 series · 13 of 46 positions shown, 15 images · IV contrast (OMNI 350)
Comparison: Chest radiograph dated 10/01/2018 and CT of the abdomen
pelvis dated 09/29/2011

CLINICAL DATA: 58-year-old female with chest pain.

EXAM:
CT ANGIOGRAPHY CHEST, ABDOMEN AND PELVIS
TECHNIQUE: Multidetector CT imaging through the chest, abdomen and pelvis was
performed using the standard protocol during bolus administration of
intravenous contrast. Multiplanar reconstructed images and MIPs were
obtained and reviewed to evaluate the vascular anatomy.
CONTRAST:  100mL OMNIPAQUE IOHEXOL 350 MG/ML SOLN

[Series 7: dissection 2mm · axial · 0.90mm/px · z∈[+802,+1356]mm · 10 of 315 slices shown, 12 images]
[im 19/315  soft-tissue]
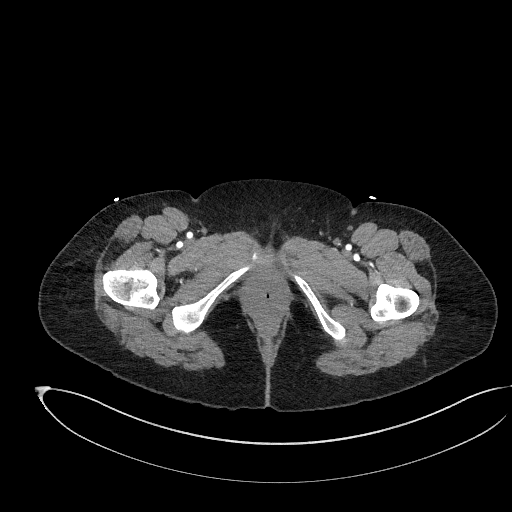
[im 19/315  bone]
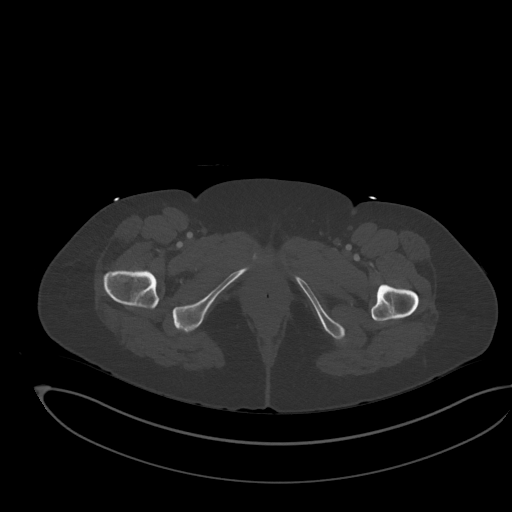
[im 56/315  soft-tissue]
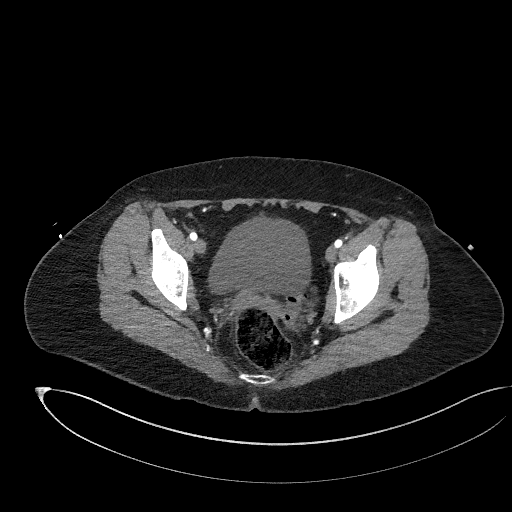
[im 93/315  soft-tissue]
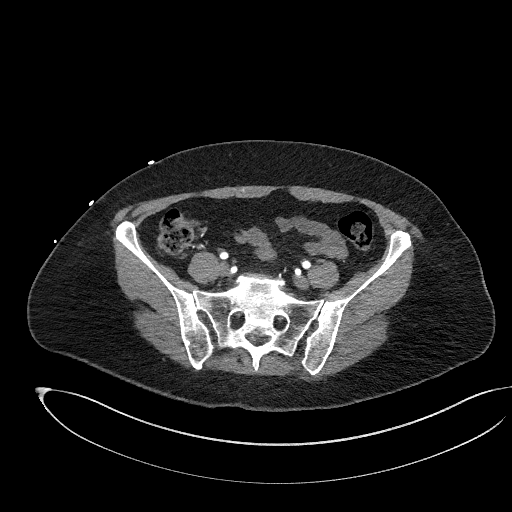
[im 111/315  soft-tissue]
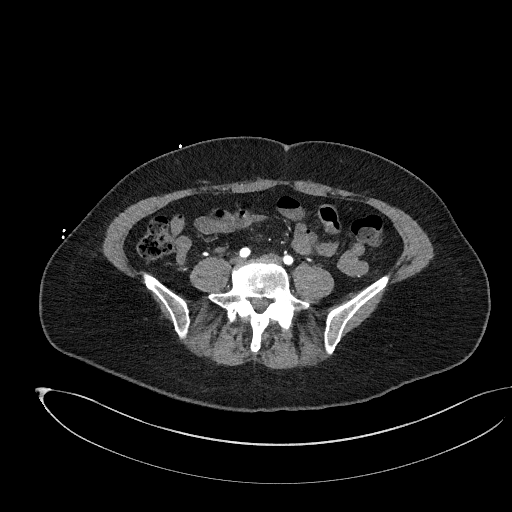
[im 148/315  soft-tissue]
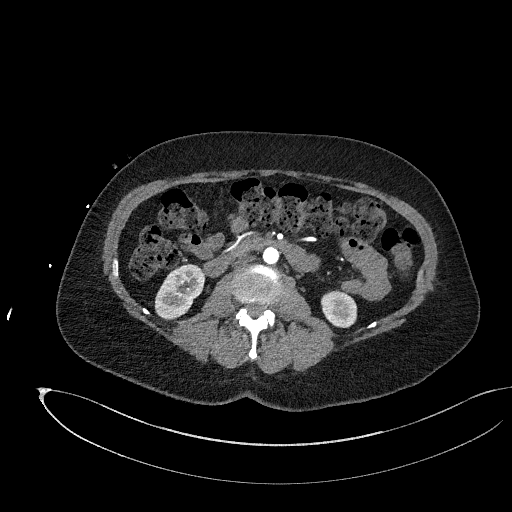
[im 167/315  soft-tissue]
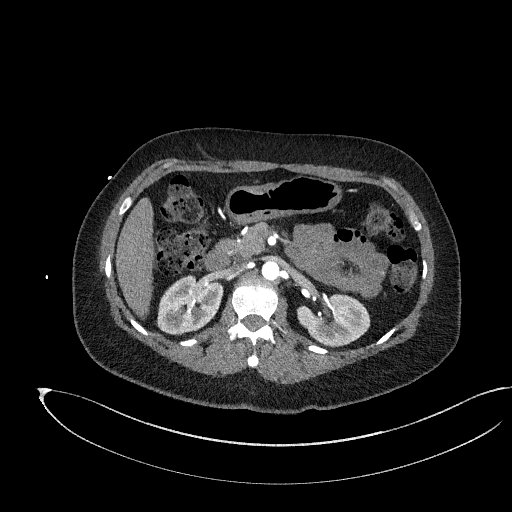
[im 204/315  soft-tissue]
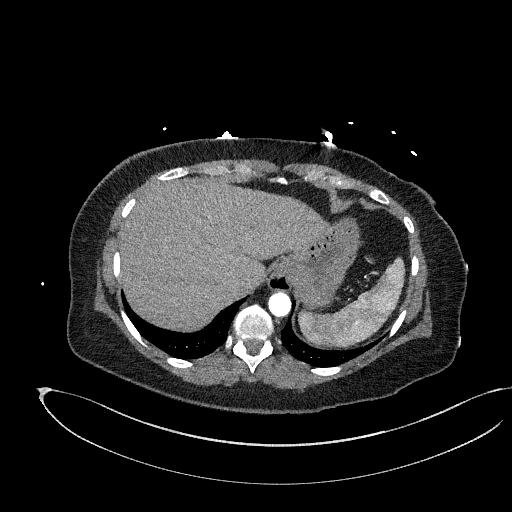
[im 241/315  soft-tissue]
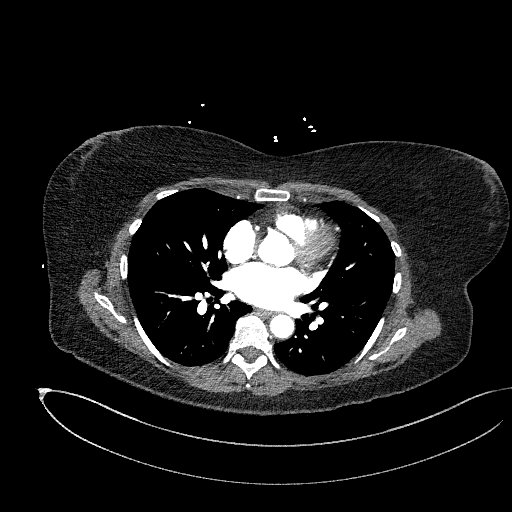
[im 259/315  soft-tissue]
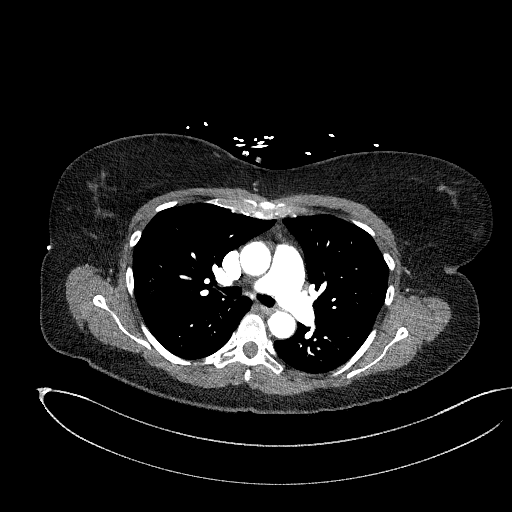
[im 259/315  bone]
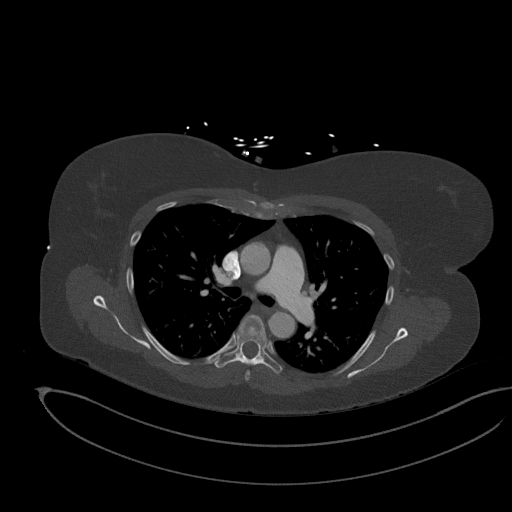
[im 296/315  soft-tissue]
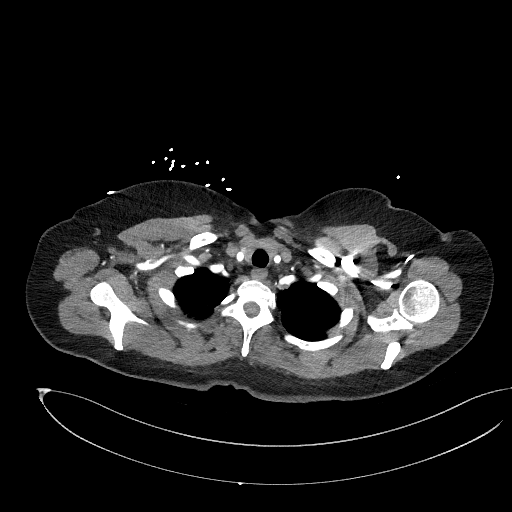

[Series 10: dissection 2mm cor · coronal · 0.70mm/px · 3 of 129 slices shown]
[im 33/129  soft-tissue]
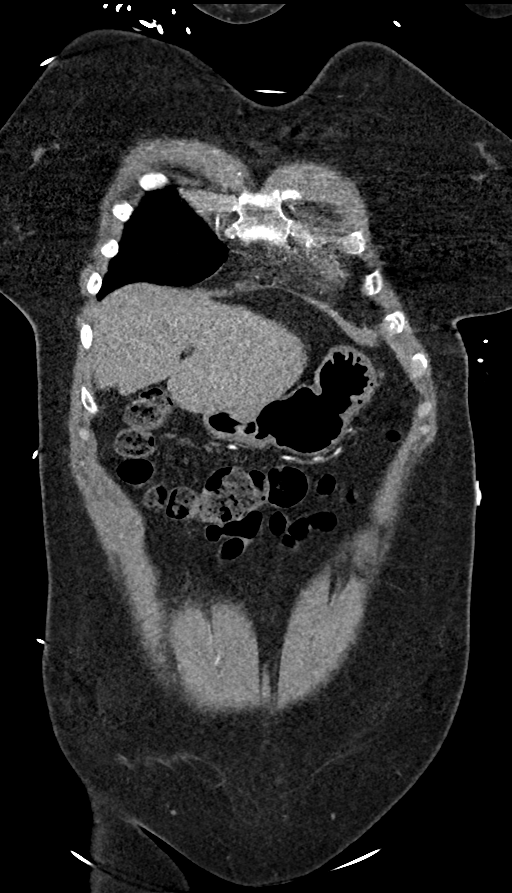
[im 65/129  soft-tissue]
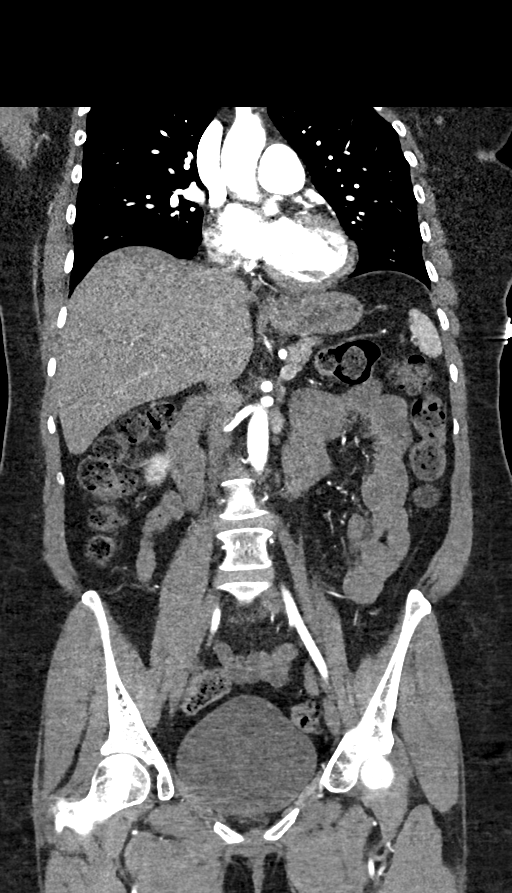
[im 97/129  soft-tissue]
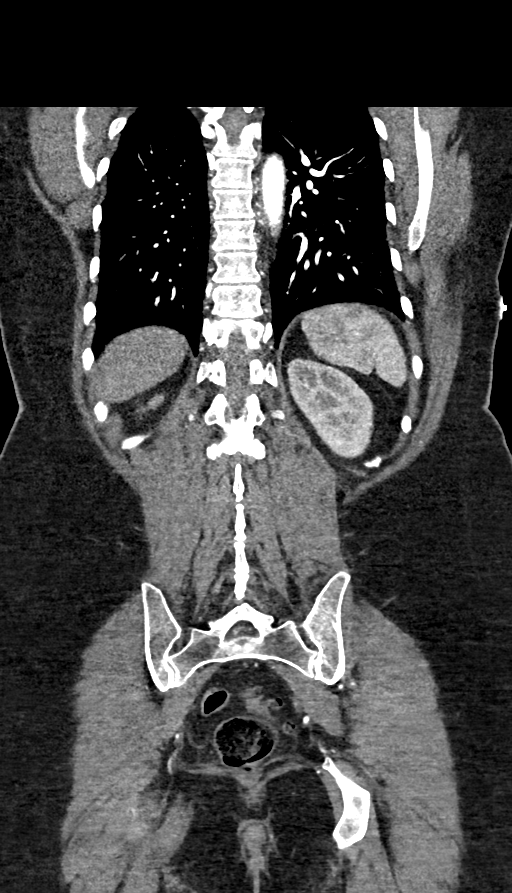

[13 of 46 positions shown; findings below may reference images not displayed]

FINDINGS: CTA CHEST FINDINGS

Cardiovascular: There is mild dilatation of the left cardiac
chambers. No pericardial effusion. The thoracic aorta is
unremarkable. The origins of the great vessels of the aortic arch
appear patent as visualized. Mild dilatation of the main pulmonary
trunk which may represent a degree of pulmonary hypertension.
Clinical correlation is recommended. No CT evidence of pulmonary
embolism.

Mediastinum/Nodes: No hilar or mediastinal adenopathy. There is a
small hiatal hernia. The esophagus and the thyroid gland are grossly
unremarkable. No mediastinal fluid collection.

Lungs/Pleura: The lungs are clear. There is no pleural effusion or
pneumothorax. The central airways are patent.

Musculoskeletal: No chest wall abnormality. No acute or significant
osseous findings.

Review of the MIP images confirms the above findings.

CTA ABDOMEN AND PELVIS FINDINGS

VASCULAR

Aorta: Normal caliber aorta without aneurysm, dissection, vasculitis
or significant stenosis.

Celiac: Patent without evidence of aneurysm, dissection, vasculitis
or significant stenosis.

SMA: Patent without evidence of aneurysm, dissection, vasculitis or
significant stenosis.

Renals: Both renal arteries are patent without evidence of aneurysm,
dissection, vasculitis, fibromuscular dysplasia or significant
stenosis.

IMA: Patent without evidence of aneurysm, dissection, vasculitis or
significant stenosis.

Inflow: Patent without evidence of aneurysm, dissection, vasculitis
or significant stenosis.

Veins: No obvious venous abnormality within the limitations of this
arterial phase study.

Review of the MIP images confirms the above findings.

NON-VASCULAR

No intra-abdominal free air or free fluid.

Hepatobiliary: The liver is unremarkable. No intrahepatic biliary
ductal dilatation. Cholecystectomy.

Pancreas: Unremarkable. No pancreatic ductal dilatation or
surrounding inflammatory changes.

Spleen: Normal in size without focal abnormality.

Adrenals/Urinary Tract: Adrenal glands are unremarkable. Kidneys are
normal, without renal calculi, focal lesion, or hydronephrosis.
Bladder is unremarkable.

Stomach/Bowel: There is sigmoid diverticulosis without active
inflammatory changes. There is moderate stool throughout the colon.
There is no bowel obstruction or active inflammation. There is a 2
cm duodenal diverticulum. The appendix is normal.

Lymphatic: No adenopathy.

Reproductive: Hysterectomy. No pelvic mass.

Other: None

Musculoskeletal: Degenerative changes primarily at L5-S1. No acute
osseous pathology.

Review of the MIP images confirms the above findings.
IMPRESSION: 1. No acute intrathoracic, abdominal, or pelvic pathology. No aortic
dissection or aneurysm. No CT evidence of pulmonary embolism.
2. Colonic diverticulosis. No bowel obstruction or active
inflammation. Normal appendix.

## 2022-02-02 ENCOUNTER — Encounter (INDEPENDENT_AMBULATORY_CARE_PROVIDER_SITE_OTHER): Payer: Self-pay | Admitting: Gastroenterology

## 2023-07-05 ENCOUNTER — Emergency Department (HOSPITAL_COMMUNITY)
Admission: EM | Admit: 2023-07-05 | Discharge: 2023-07-05 | Disposition: A | Discharge status: Home / Self Care | Attending: Emergency Medicine | Admitting: Emergency Medicine

## 2023-07-05 ENCOUNTER — Other Ambulatory Visit: Payer: Self-pay

## 2023-07-05 ENCOUNTER — Emergency Department (HOSPITAL_COMMUNITY)

## 2023-07-05 ENCOUNTER — Encounter (HOSPITAL_COMMUNITY): Payer: Self-pay | Admitting: Emergency Medicine

## 2023-07-05 DIAGNOSIS — R42 Dizziness and giddiness: Secondary | ICD-10-CM | POA: Diagnosis present

## 2023-07-05 LAB — CBC WITH DIFFERENTIAL/PLATELET
Abs Immature Granulocytes: 0.01 10*3/uL (ref 0.00–0.07)
Basophils Absolute: 0 10*3/uL (ref 0.0–0.1)
Basophils Relative: 0 %
Eosinophils Absolute: 0 10*3/uL (ref 0.0–0.5)
Eosinophils Relative: 0 %
HCT: 40.8 % (ref 36.0–46.0)
Hemoglobin: 13.4 g/dL (ref 12.0–15.0)
Immature Granulocytes: 0 %
Lymphocytes Relative: 28 %
Lymphs Abs: 1.4 10*3/uL (ref 0.7–4.0)
MCH: 29.8 pg (ref 26.0–34.0)
MCHC: 32.8 g/dL (ref 30.0–36.0)
MCV: 90.7 fL (ref 80.0–100.0)
Monocytes Absolute: 0.4 10*3/uL (ref 0.1–1.0)
Monocytes Relative: 9 %
Neutro Abs: 3.1 10*3/uL (ref 1.7–7.7)
Neutrophils Relative %: 63 %
Platelets: 234 10*3/uL (ref 150–400)
RBC: 4.5 MIL/uL (ref 3.87–5.11)
RDW: 12.5 % (ref 11.5–15.5)
WBC: 5 10*3/uL (ref 4.0–10.5)
nRBC: 0 % (ref 0.0–0.2)

## 2023-07-05 LAB — COMPREHENSIVE METABOLIC PANEL WITH GFR
ALT: 22 U/L (ref 0–44)
AST: 20 U/L (ref 15–41)
Albumin: 4.3 g/dL (ref 3.5–5.0)
Alkaline Phosphatase: 60 U/L (ref 38–126)
Anion gap: 9 (ref 5–15)
BUN: 15 mg/dL (ref 8–23)
CO2: 27 mmol/L (ref 22–32)
Calcium: 9.1 mg/dL (ref 8.9–10.3)
Chloride: 104 mmol/L (ref 98–111)
Creatinine, Ser: 0.7 mg/dL (ref 0.44–1.00)
GFR, Estimated: >60 mL/min (ref >60)
Glucose, Bld: 104 mg/dL — ABNORMAL HIGH (ref 70–99)
Potassium: 3.9 mmol/L (ref 3.5–5.1)
Sodium: 140 mmol/L (ref 135–145)
Total Bilirubin: 0.5 mg/dL (ref 0.0–1.2)
Total Protein: 7.3 g/dL (ref 6.5–8.1)

## 2023-07-05 MED ORDER — ONDANSETRON 4 MG PO TBDP: 4.0000 mg | ORAL_TABLET | Freq: Three times a day (TID) | ORAL | 0 refills | Status: AC | PRN

## 2023-07-05 MED ORDER — LORAZEPAM 2 MG/ML IJ SOLN
0.5000 mg | Freq: Once | INTRAMUSCULAR | Status: DC
  Filled 2023-07-05: qty 1

## 2023-07-05 MED ORDER — ONDANSETRON HCL 4 MG/2ML IJ SOLN
4.0000 mg | Freq: Once | INTRAMUSCULAR | Status: AC
  Administered 2023-07-05: 4 mg via INTRAVENOUS
  Filled 2023-07-05: qty 2

## 2023-07-05 NOTE — ED Notes (Signed)
 All belongings returned to patient

## 2023-07-05 NOTE — Discharge Instructions (Signed)
 As discussed, your evaluation today has been largely reassuring.  But, it is important that you monitor your condition carefully, and do not hesitate to return to the ED if you develop new, or concerning changes in your condition. ? ?Otherwise, please follow-up with your physician for appropriate ongoing care. ? ?

## 2023-07-05 NOTE — ED Triage Notes (Signed)
 Pt bib ems w/ c/o of dizziness, tremors and blurred vision. Pt was talking the phone and acutely experienced the above symptoms. PT is under a lot of stress with a very sick husband and the loss of her mother on 4/3. Pt believes this could be stress or anxiety but was concerned she may have had a stroke.

## 2023-07-05 NOTE — ED Notes (Signed)
 Pt handed over a taser, mace and 2 knives. Placed in bag with pt sticker

## 2023-07-05 NOTE — ED Provider Notes (Signed)
 [place] EMERGENCY DEPARTMENT AT [facility] Provider Note   CSN: [id number] Arrival date & time: 07/05/23  1138     History  Chief Complaint  Patient presents with   Dizziness    Ariel Ramirez is a 63 y.o. female.  HPI Patient presents with acute onset dizziness, unsteadiness.  She notes that she has had similar prior evaluations, has seen a neurologist.  Today's event was acute in onset, and she has improved, but continues to feel unsteady somewhat.  No focal weakness, no speech difficulty.     Home Medications Prior to Admission medications   Medication Sig Start Date End Date Taking? Authorizing Provider  atorvastatin  (LIPITOR) 20 MG tablet Take 1 tablet (20 mg total) by mouth daily at 6 PM. Please make overdue appt with Dr. Avanell Bob before anymore refills. Thank you 2nd attempt 01/10/20  Yes Elmyra Haggard, MD  buPROPion (WELLBUTRIN XL) 150 MG 24 hr tablet Take 150 mg by mouth. 05/07/20  Yes [provider]  hydrOXYzine (ATARAX) 10 MG tablet 5-10 mg at bedtime as needed for anxiety. 12/23/17  Yes [provider]  lamoTRIgine  (LAMICTAL ) 100 MG tablet Take 100 mg by mouth 2 (two) times daily.    Yes [provider]  levothyroxine  (SYNTHROID ) 112 MCG tablet Take 112 mcg by mouth. 06/19/22  Yes [provider]  Melatonin 10 MG TABS Take 10 mg by mouth at bedtime.   Yes [provider]  mirabegron ER (MYRBETRIQ) 25 MG TB24 tablet Take 25 mg by mouth daily. 06/28/23  Yes [provider]  Vitamin D , Ergocalciferol , (DRISDOL) 1.25 MG (50000 UNIT) CAPS capsule Take 50,000 Units by mouth every 7 (seven) days. 05/07/20  Yes [provider]      Allergies    Doxycycline    Review of Systems   Review of Systems  Physical Exam Updated Vital Signs BP (!) 148/74 (BP Location: Right Arm)   Pulse 77   Temp 98.6 F (37 C) (Oral)   Resp 16   Ht 5\' 6"  (1.676 m)   Wt 67.6 kg   SpO2 100%   BMI 24.05 kg/m  Physical  Exam Vitals and nursing note reviewed.  Constitutional:      General: She is not in acute distress.    Appearance: She is well-developed.  HENT:     Head: Normocephalic and atraumatic.  Eyes:     Conjunctiva/sclera: Conjunctivae normal.  Cardiovascular:     Rate and Rhythm: Normal rate and regular rhythm.  Pulmonary:     Effort: Pulmonary effort is normal. No respiratory distress.     Breath sounds: Normal breath sounds. No stridor.  Abdominal:     General: There is no distension.  Skin:    General: Skin is warm and dry.  Neurological:     General: No focal deficit present.     Mental Status: She is alert and oriented to person, place, and time.     Cranial Nerves: No cranial nerve deficit.  Psychiatric:        Mood and Affect: Mood normal.     ED Results / Procedures / Treatments   Labs (all labs ordered are listed, but only abnormal results are displayed) Labs Reviewed  COMPREHENSIVE METABOLIC PANEL WITH GFR - Abnormal; Notable for the following components:      Result Value   Glucose, Bld 104 (*)    All other components within normal limits  CBC WITH DIFFERENTIAL/PLATELET    EKG EKG Interpretation  Date/Time:  Monday July 05 2023 14:07:21 EDT Ventricular Rate:  71 PR Interval:  164 QRS Duration:  71 QT Interval:  420 QTC Calculation: 457 R Axis:   26  Text Interpretation: Sinus rhythm Low voltage, precordial leads Borderline T abnormalities, anterior leads Confirmed by Dorenda Gandy [phone]) on 07/05/2023 2:15:55 PM  Radiology No results found.  Procedures Procedures    Medications Ordered in ED Medications  LORazepam  (ATIVAN ) injection 0.5 mg (0 mg Intravenous Hold 07/05/23 1442)    ED Course/ Medical Decision Making/ A&P                                 Medical Decision Making Adult female presents with acute onset dizziness, unsteadiness, now improving, though with mild persistent symptoms. No focal weakness is somewhat reassuring, but given  concern for the age, acuity, patient had head CT, labs, monitoring. Cardiac 75 sinus Pulse ox 100% room air normal   Amount and/or Complexity of Data Reviewed External Data Reviewed: notes. Labs: ordered. Decision-making details documented in ED Course. Radiology: ordered and independent interpretation performed. Decision-making details documented in ED Course. ECG/medicine tests: ordered and independent interpretation performed. Decision-making details documented in ED Course.  Risk Prescription drug management.   3:19 PM Labs unremarkable, patient without neuro changes, and to my evaluation CT head without changes.  ECG without arrhythmia, patient awake, alert, sitting upright, suspicion for vertigo, particularly given absence of any current evidence for distress. Patient comfortable discharge, outpatient follow-up pending formal interpretation of head CT.        Final Clinical Impression(s) / ED Diagnoses Final diagnoses:  Dizziness    Rx / DC Orders ED Discharge Orders     None         Dorenda Gandy, MD 07/05/23 [phone]

## 2023-12-29 ENCOUNTER — Encounter (INDEPENDENT_AMBULATORY_CARE_PROVIDER_SITE_OTHER): Payer: Self-pay | Admitting: Gastroenterology
